# Patient Record
Sex: Male | Born: 1935 | Race: White | Hispanic: No | Smoking: Former smoker
Health system: Southern US, Community
[De-identification: ages and names within clinical notes are randomized; demographics above are authoritative.]

## PROBLEM LIST (undated history)

## (undated) DIAGNOSIS — M1990 Unspecified osteoarthritis, unspecified site: Secondary | ICD-10-CM

## (undated) DIAGNOSIS — K219 Gastro-esophageal reflux disease without esophagitis: Secondary | ICD-10-CM

## (undated) DIAGNOSIS — K589 Irritable bowel syndrome without diarrhea: Secondary | ICD-10-CM

## (undated) DIAGNOSIS — E119 Type 2 diabetes mellitus without complications: Secondary | ICD-10-CM

## (undated) DIAGNOSIS — E7800 Pure hypercholesterolemia, unspecified: Secondary | ICD-10-CM

## (undated) DIAGNOSIS — F319 Bipolar disorder, unspecified: Secondary | ICD-10-CM

## (undated) HISTORY — PX: CHOLECYSTECTOMY: SHX55

## (undated) HISTORY — DX: Irritable bowel syndrome, unspecified: K58.9

## (undated) HISTORY — PX: CATARACT EXTRACTION, BILATERAL: SHX1313

## (undated) HISTORY — PX: HEMORROIDECTOMY: SUR656

## (undated) HISTORY — DX: Gastro-esophageal reflux disease without esophagitis: K21.9

## (undated) HISTORY — DX: Unspecified osteoarthritis, unspecified site: M19.90

## (undated) HISTORY — DX: Bipolar disorder, unspecified: F31.9

## (undated) HISTORY — DX: Pure hypercholesterolemia, unspecified: E78.00

## (undated) HISTORY — DX: Type 2 diabetes mellitus without complications: E11.9

---

## 2001-03-13 ENCOUNTER — Inpatient Hospital Stay (HOSPITAL_COMMUNITY): Admission: AD | Admit: 2001-03-13 | Discharge: 2001-03-15 | Payer: Self-pay | Admitting: *Deleted

## 2001-03-13 ENCOUNTER — Encounter: Payer: Self-pay | Admitting: Emergency Medicine

## 2001-03-13 ENCOUNTER — Emergency Department (HOSPITAL_COMMUNITY): Admission: EM | Admit: 2001-03-13 | Discharge: 2001-03-13 | Payer: Self-pay | Admitting: Emergency Medicine

## 2001-03-27 ENCOUNTER — Encounter: Admission: RE | Admit: 2001-03-27 | Discharge: 2001-06-25 | Payer: Self-pay | Admitting: *Deleted

## 2002-01-20 ENCOUNTER — Encounter: Payer: Self-pay | Admitting: Otolaryngology

## 2002-01-20 ENCOUNTER — Encounter: Admission: RE | Admit: 2002-01-20 | Discharge: 2002-01-20 | Payer: Self-pay | Admitting: Otolaryngology

## 2002-01-23 ENCOUNTER — Encounter: Admission: RE | Admit: 2002-01-23 | Discharge: 2002-01-23 | Payer: Self-pay | Admitting: Otolaryngology

## 2002-01-23 ENCOUNTER — Encounter: Payer: Self-pay | Admitting: Otolaryngology

## 2005-05-05 ENCOUNTER — Encounter: Admission: RE | Admit: 2005-05-05 | Discharge: 2005-05-05 | Payer: Self-pay | Admitting: Family Medicine

## 2006-08-13 ENCOUNTER — Ambulatory Visit (HOSPITAL_COMMUNITY): Admission: RE | Admit: 2006-08-13 | Discharge: 2006-08-13 | Payer: Self-pay | Admitting: General Surgery

## 2006-08-13 ENCOUNTER — Encounter (INDEPENDENT_AMBULATORY_CARE_PROVIDER_SITE_OTHER): Payer: Self-pay | Admitting: Specialist

## 2008-01-31 ENCOUNTER — Encounter: Admission: RE | Admit: 2008-01-31 | Discharge: 2008-01-31 | Payer: Self-pay | Admitting: Family Medicine

## 2008-02-14 ENCOUNTER — Encounter: Admission: RE | Admit: 2008-02-14 | Discharge: 2008-02-14 | Payer: Self-pay | Admitting: Family Medicine

## 2009-11-16 ENCOUNTER — Inpatient Hospital Stay (HOSPITAL_COMMUNITY): Admission: EM | Admit: 2009-11-16 | Discharge: 2009-11-18 | Payer: Self-pay | Admitting: Emergency Medicine

## 2010-07-01 LAB — GLUCOSE, CAPILLARY
Glucose-Capillary: 129 mg/dL — ABNORMAL HIGH (ref 70–99)
Glucose-Capillary: 138 mg/dL — ABNORMAL HIGH (ref 70–99)

## 2010-07-01 LAB — BASIC METABOLIC PANEL
BUN: 12 mg/dL (ref 6–23)
CO2: 25 mEq/L (ref 19–32)
CO2: 27 mEq/L (ref 19–32)
Calcium: 10 mg/dL (ref 8.4–10.5)
Calcium: 9.6 mg/dL (ref 8.4–10.5)
Chloride: 105 mEq/L (ref 96–112)
Creatinine, Ser: 0.75 mg/dL (ref 0.4–1.5)
Creatinine, Ser: 0.91 mg/dL (ref 0.4–1.5)
Glucose, Bld: 116 mg/dL — ABNORMAL HIGH (ref 70–99)
Glucose, Bld: 181 mg/dL — ABNORMAL HIGH (ref 70–99)
Potassium: 3.8 mEq/L (ref 3.5–5.1)
Sodium: 140 mEq/L (ref 135–145)

## 2010-07-01 LAB — HEMOGLOBIN A1C
Hgb A1c MFr Bld: 7 % — ABNORMAL HIGH (ref <5.7)
Mean Plasma Glucose: 154 mg/dL — ABNORMAL HIGH (ref <117)

## 2010-07-01 LAB — DIFFERENTIAL
Basophils Absolute: 0 10*3/uL (ref 0.0–0.1)
Basophils Relative: 1 % (ref 0–1)
Lymphs Abs: 1.9 10*3/uL (ref 0.7–4.0)
Neutro Abs: 4 10*3/uL (ref 1.7–7.7)

## 2010-07-01 LAB — CBC
HCT: 35.3 % — ABNORMAL LOW (ref 39.0–52.0)
Hemoglobin: 11.5 g/dL — ABNORMAL LOW (ref 13.0–17.0)
Hemoglobin: 12 g/dL — ABNORMAL LOW (ref 13.0–17.0)
MCH: 30.4 pg (ref 26.0–34.0)
MCHC: 34 g/dL (ref 30.0–36.0)
MCV: 88.8 fL (ref 78.0–100.0)
Platelets: 161 10*3/uL (ref 150–400)
Platelets: 171 10*3/uL (ref 150–400)
RBC: 3.94 MIL/uL — ABNORMAL LOW (ref 4.22–5.81)

## 2010-07-01 LAB — PROTIME-INR
INR: 1.13 (ref 0.00–1.49)
Prothrombin Time: 14.7 seconds (ref 11.6–15.2)

## 2010-09-02 NOTE — Discharge Summary (Signed)
[place]  Patient:    Steven Garcia, Steven Garcia Visit Number: [id number] MRN: [id number]          Service Type: MED Location: [phone] Attending Physician:  Anastasio Auerbach Dictated by:   Viviana Simpler, M.D. Admit Date:  03/13/2001 Disc. Date: 03/15/01   CC:         Steven Grove., M.D.  Dr. Jonelle Sports   Discharge Summary  DATE OF BIRTH:  01/21/1929.  CONSULTS:  Dr. Laqueta Carina, Montez Hageman., M.D.  PROCEDURES:  Cardiolite Stress Test.  DISCHARGE DIAGNOSES: 1. Substernal chest pain consistent with unstable angina pectoris, cardiac    work-up negative.  2. History of hiatal hernia, asymptomatic on Prevacid. 3. Type 2 diabetes mellitus, new diagnosis (hemoglobin A1c 15). 4. Hypertension on arrival, new diagnosis, normotensive at discharge. 5. History of deviated septal surgery. 6. History of neck surgery, remote, with chronic episodic residual neck    pain.  DISCHARGE MEDICATIONS: 1. Prevacid, to be continued at the same dose (he does not recall). 2. Glucotrol 5 mg p.o. q.a.m.  DISCHARGE FOLLOWUP:  The patient is rescheduled with Dr. Gaylan Gerold in one week for a diabetes recheck. [facility] is [facility] education.  SUMMARY:  The patient is a 77 year old man with no known history of arthrosclerotic coronary disease, hypertension and diabetes who presented with chest pain. He awoke early on the morning of admission and went deer hunting (walked a few miles). He ate cereal for breakfast and missed his lunch. He was well until approximately noon the day of admission when, while driving, he developed sharp substernal chest pain radiating to the jaw. He went on to the bank, pain escalated, and he drove himself to the ER. Nitroglycerin IV resolved the chest pain and eased his jaw pain. He did not experience dyspnea, diaphoresis, nausea or dizziness, or arm symptoms. He had a similar episode three to four  years ago, but did not seek treatment. He is very active - one week ago was dragging a deer through the woods and although he got out of breath, he had no chest pain with this exertion. His hiatal hernia, I have discovered, has been well controlled with Prevacid and todays chest pain felt nothing like it.  PHYSICAL EXAMINATION:  VITAL SIGNS:  Blood pressure 175/103 on arrival, 135/74 after sublingual nitroglycerin administered. Pulse 90, respirations 20, afebrile.  PHYSICAL EVALUATION:  Unremarkable.  LABORATORY DATA:  Admission hemoglobin 13.9, BUN 8, creatinine 0.8, glucose 270. Chest x-ray was clear and EKG was normal sinus rhythm, nonischemic.  The patient was admitted for further work-up of chest pain concerning for unstable angina pectoris, on rule out MI protocol.  HOSPITAL COURSE BY PROBLEMS: #1 - Substernal chest pain:  The patient was placed on IV nitroglycerin and IV heparin given concern about his symptoms. He was administered aspirin and beta-blocker, and cardiology was called early in admission. Myocardial infarction was ruled out by serial enzymes and EKGs. A Cardiolite Stress Test was performed with treadmill protocol during which the patient had no symptoms and performed well. No evidence of ischemia, wall motion abnormalities, and ejection fraction is perserved at 60 to 65%. No further chest pain occurred this admission.  The patient may have suffered chest pain as a result of esophageal spasm and may have had some exacerbation by emotional stress. He has had significant social stress in the family of his 49 year old stepson who is a disabled veteran and is  currently living in a very small household. He has had extensive discussions with family members regarding this stressor and feels that they have a reasonable plan worked out to reduce his anxiety. Should further chest pain occur, reassessment would be required.  #2 - New onset diabetes mellitus:  The patient  was noted to have hyperglycemia which was confirmed as diabetes by a hemoglobin A1c of 15. He consumes several sodas per day which is probably exacerbating. While in the hospital, he was begun on Glucotrol 5 mg p.o. q.d. with supplemental sliding scale insulin with fairly good results. He has been referred for diabetes education.  First appointment at one week with Dr. Gaylan Gerold may result in titration of this medication. He seems invested in learning how to control this, and I presume that altering his soda consumption will help greatly. Dictated by:   Viviana Simpler, M.D. Attending Physician:  Anastasio Auerbach DD:  03/15/01 TD:  03/15/01 Job: [id number]

## 2010-09-02 NOTE — Consult Note (Signed)
[facility]  Patient:    BURLIN, MCNAIR Visit Number: [id number] MRN: [id number]          Service Type: MED Location: [phone] Attending Physician:  Anastasio Auerbach Dictated by:   Alvia Grove., M.D. Proc. Date: 03/13/01 Admit Date:  03/13/2001   CC:         Dyanne Carrel, M.D.  [facility]   Consultation Report  HISTORY:  The patient is a 77 year old gentleman with the recent diagnosis of hypertension and diabetes mellitus.  He presents to [facility] with some episodes of chest pain.  The patient has been quite healthy for many years.  He is quite active, and deer hunts on a regular basis.  He has been deer hunting for the past two weekends and, last week, drug a several-hundred-pound deer for several hundred yards through the woods.  He did not have any problems with that exertion.  he went hunting this morning and, again, walked for miles without any significant problems.  Later today, while driving, he developed severe substernal chest pain which radiated to his right chest and up into his jaws.  The pain lasted for between 15 and 30 minutes, and started to subside spontaneously.  He drove to the emergency room, and the pain was completely relieved with sublingual nitroglycerin.  He has been pain free since.  The pain was not associated with any diaphoresis, nausea, vomiting, syncope, or presyncope.  He denies any episodes of orthopnea or PND.  CURRENT MEDICATIONS:  None.  ALLERGIES:  None.  PAST MEDICAL HISTORY: 1. Hypertension diagnosed recently. 2. Hyperglycemia.  SOCIAL HISTORY:  The patient smoked for 30 years, but has not smoked for the past 30 or so years.  FAMILY HISTORY:  Positive for coronary artery disease.  REVIEW OF SYSTEMS:  He denies any heat or cold intolerance, weight gain or weight loss.  He denies any problems with his eyes, ears, nose or throat.  He denies any cough or sputum  production or significant shortness of breath.  He denies any previous episodes of chest pain prior to today.  He denies any problems with his urinary system or GI tract.  He denies any nausea, vomiting or change in his bowel habits.  He denies any hematuria or dysuria.  he denies any rash, skin nodules or easy bruisability.  PHYSICAL EXAMINATION:  GENERAL:  Elderly gentleman in no acute distress.  He is alert and oriented x 3 and his mood and affect are normal.  VITAL SIGNS:  Initial blood pressure was 175/103.  Later, it was 132/80 on a nitroglycerin drip.  His heart rate was 90.  His respirations were 20 and his temperature was 97.8.  NECK:  Carotids were 2+.  No bruits.  No JVD.  No thyromegaly.  No lymphadenopathy.  LUNGS:  Clear to auscultation.  BACK:  Nontender.  HEART:  Regular rate.  S1 and S2.  PMI is nondisplaced.  No murmurs.  ABDOMEN:  Good bowel sounds.  Nontender.  EXTREMITIES:  No clubbing, cyanosis, or edema.  No calf tenderness.  Good pulses.  NEUROLOGIC:  Nonfocal.  LABORATORY DATA:  EKG revealed normal sinus rhythm.  No ST or T wave changes. Essentially, the EKG was normal.  CPK enzymes were negative.  The rest of his lab work is essentially normal.  ASSESSMENT:  The patient presents with episodes of chest pain with radiation to his jaws.  It was not associated with any diaphoresis, nausea, vomiting  or presyncope.  The pain is somewhat concerning for unstable angina.  I agree with the plans to admit him and to collect cardiac enzymes.  We will continue him in heparin tonight.  Will schedule him for a stress Cardiolite study for tomorrow.  If he has any further episodes of chest pain or has positive cardiac enzymes, then we will need to consider a heart catheterization on Friday.  The patients blood pressure is fairly well controlled on his nitroglycerin drip.  I suspect that he will be able to come under fairly good control with a small amount of  medications.  His glucose level has been fairly well elevated here.  His BCG was 231.  He will need some counseling regarding his diet.  He may need to be started on some diabetic oral agents.  Thank you very much for this consult. Dictated by:   Alvia Grove., M.D. Attending Physician:  Anastasio Auerbach DD:  03/13/01 TD:  03/14/01 Job: [id number]

## 2010-09-02 NOTE — Op Note (Signed)
Steven Garcia, Steven Garcia               ACCOUNT NO.:  [id number]   MEDICAL RECORD NO.:  [id number]          PATIENT TYPE:  AMB   LOCATION:  DAY                          FACILITY:  [facility]   PHYSICIAN:  Angelia Mould. Derrell Lolling, M.D.DATE OF BIRTH:  [birth year]   DATE OF PROCEDURE:  08/13/2006  DATE OF DISCHARGE:                               OPERATIVE REPORT   PREOPERATIVE DIAGNOSIS:  Internal and external hemorrhoids, inflamed  anal papilla.   POSTOPERATIVE DIAGNOSIS:  Internal and external hemorrhoids, inflamed  anal papilla.   OPERATIONS PERFORMED:  Rigid proctoscopy, anal dilatation, internal and  external hemorrhoidectomy (right anterior, posterior).   SURGEON:  Angelia Mould. Derrell Lolling, M.D.   OPERATIVE INDICATIONS:  This is a 77 year old white man who has ongoing  problems with hemorrhoids.  He has been injected for bleeding internal  hemorrhoids 3 or 4 times, which gives him transient relief.  He  continues to have protrusion and bleeding.  On exam he has minimal  external hemorrhoids, but he has significant internal hemorrhoids  posteriorly and on the right anterior.  He also has, what looks like, an  anal papilla perched on top of the posterior hemorrhoid -- not much on  the left.  Options for intervention were discussed and we decided to go  ahead with hemorrhoidectomy.   OPERATIVE TECHNIQUE:  Following the induction of general endotracheal  anesthesia, the patient was placed in the dorsalithotomy position.  The  perianal area was prepped and draped in a sterile fashion.  Externally  the sphincter muscles were infiltrated with 0.5% Marcaine with  epinephrine -- a total of about 15-20 mL.  Internally I injected his  internal hemorrhoids with 0.5% Marcaine with epinephrine, 9 mL, mixed  with Wydase 1 mL.  We did a gentle anal dilatation over the space of  about 4 or 5 minutes.  I inserted the anoscope and observed fairly  significant hemorrhoidal complex posteriorly with the anal papilla  associated with this, and also some internal hemorrhoids with thrombosis  anteriorly.   Both of these areas were excised in the following manner:  I placed a  transfixion suture of 2-0 Vicryl at the apex of the hemorrhoidal pile.  The hemorrhoids were excised with electrocautery with good traction and  counter traction; leaving the sphincter muscles intact.  The mucosa was  then closed with a running, locking suture of 2-0 Vicryl; and the suture  line was taken just out past the dentate line  a little bit.  This was  done posteriorly and then in the right anterior position.  The areas  were then inspected and the suture lines were found to be complete with  good closure; no gaps.  No bleeding.  No hematoma.  There did not appear  to be any significant hemorrhoidal tissue on the left that required  excision, and so we stopped at that point.  We observed this for about 5  minutes and saw no bleeding.  External bandages were placed and the  patient taken to the recovery room in stable condition.   ESTIMATED BLOOD LOSS:  About 20 mL or less.  COMPLICATIONS:  None.   COUNTS:  Sponge, needle and instrument counts were correct.      Angelia Mould. Derrell Lolling, M.D.  Electronically Signed     HMI/MEDQ  D:  08/13/2006  T:  08/14/2006  Job:  [id number]   cc:   Bryan Lemma. Manus Gunning, M.D.  Fax: [phone]

## 2011-09-08 ENCOUNTER — Ambulatory Visit
Admission: RE | Admit: 2011-09-08 | Discharge: 2011-09-08 | Disposition: A | Discharge status: Home / Self Care | Payer: Medicare Other | Source: Ambulatory Visit | Attending: Family Medicine | Admitting: Family Medicine

## 2011-09-08 ENCOUNTER — Other Ambulatory Visit: Payer: Self-pay | Admitting: Family Medicine

## 2011-09-08 DIAGNOSIS — R9389 Abnormal findings on diagnostic imaging of other specified body structures: Secondary | ICD-10-CM

## 2011-09-08 DIAGNOSIS — R05 Cough: Secondary | ICD-10-CM

## 2013-02-24 ENCOUNTER — Ambulatory Visit
Admission: RE | Admit: 2013-02-24 | Discharge: 2013-02-24 | Disposition: A | Discharge status: Home / Self Care | Payer: Medicare Other | Source: Ambulatory Visit | Attending: Family Medicine | Admitting: Family Medicine

## 2013-02-24 ENCOUNTER — Other Ambulatory Visit: Payer: Self-pay | Admitting: Family Medicine

## 2013-02-24 DIAGNOSIS — E871 Hypo-osmolality and hyponatremia: Secondary | ICD-10-CM

## 2013-07-08 ENCOUNTER — Encounter: Payer: Self-pay | Admitting: Neurology

## 2013-07-09 ENCOUNTER — Ambulatory Visit: Payer: Medicare Other | Admitting: Neurology

## 2013-07-14 ENCOUNTER — Encounter: Payer: Self-pay | Admitting: Neurology

## 2013-07-22 ENCOUNTER — Encounter: Payer: Self-pay | Admitting: Neurology

## 2013-07-22 ENCOUNTER — Ambulatory Visit: Payer: Medicare Other | Admitting: Neurology

## 2013-07-23 ENCOUNTER — Encounter: Payer: Self-pay | Admitting: Neurology

## 2013-07-23 ENCOUNTER — Ambulatory Visit (INDEPENDENT_AMBULATORY_CARE_PROVIDER_SITE_OTHER): Payer: Medicare Other | Admitting: Neurology

## 2013-07-23 ENCOUNTER — Encounter (INDEPENDENT_AMBULATORY_CARE_PROVIDER_SITE_OTHER): Payer: Self-pay

## 2013-07-23 VITALS — BP 115/67 | HR 77 | Ht 71.25 in | Wt 164.0 lb

## 2013-07-23 DIAGNOSIS — R259 Unspecified abnormal involuntary movements: Secondary | ICD-10-CM

## 2013-07-23 DIAGNOSIS — R251 Tremor, unspecified: Secondary | ICD-10-CM

## 2013-07-23 DIAGNOSIS — F319 Bipolar disorder, unspecified: Secondary | ICD-10-CM

## 2013-07-23 NOTE — Progress Notes (Signed)
[facility]    Provider:  Dr Janann Colonel Referring Provider: Gaynelle Arabian, MD Primary Care Physician:  Simona Huh, MD  CC:  Tremor evaluation  HPI:  Steven Garcia is a 79 y.o. male here as a referral from Dr. Marisue Humble for tremor evaluation  Notes the tremor started around 2 years ago. His brother noted his hands were shaking and pointed it out to Steven Garcia. He describes it predominantly as an action/intention tremor. He denies any rest component. Notes his handwriting has gotten messier over time, denies any micrographia. Will sometimes spill food when trying to eat due to tremor. No noted muscle cramping or aches. Notes some gait difficulty, slow walking but states he has a short leg and back pain that contribute to this. Tremor is worse when he is stressed or anxious. No change with caffeine. No change with EtOH. He is currently sleeping well at night. Has been told in the past that he talks in his sleep but no other known REM behavior disorder. No constipation.   Currently taking depakote $RemoveBeforeD'500mg'dpwejDecoMwifA$  three times a day for mood disorder/bipolar. He has been on a stable dose for greater than 8years. This is managed by his primary care physician Dr. Marisue Humble and patient feels it is well controlled. Has not been on any dopamine blocking agents.   No family history of tremor. He does not feel the tremor is causing much difficulty, he only notes trouble when trying to write checks.   Review of Systems: Out of a complete 14 system review, the patient complains of only the following symptoms, and all other reviewed systems are negative. + weakness, tremors, decreased energy  History   Social History  . Marital Status: Widowed    Spouse Name: N/A    Number of Children: N/A  . Years of Education: N/A   Occupational History  . Not on file.   Social History Main Topics  . Smoking status: Former Research scientist (life sciences)  . Smokeless tobacco: Not on file  . Alcohol Use: Yes     Comment:  beer occ  . Drug Use: No  . Sexual Activity: Not on file   Other Topics Concern  . Not on file   Social History Narrative   Single, 1 child   Right handed   10th grade education   Very little caffeine    No family history on file.  Past Medical History  Diagnosis Date  . Diabetes     with proteinuria  . Hypercholesteremia   . Arthritis   . IBS (irritable bowel syndrome)   . GERD (gastroesophageal reflux disease)   . Bipolar disorder     Past Surgical History  Procedure Laterality Date  . Cholecystectomy    . Hemorroidectomy    . Cataract extraction, bilateral      Current Outpatient Prescriptions  Medication Sig Dispense Refill  . ACCU-CHEK AVIVA PLUS test strip       . ACCU-CHEK SOFTCLIX LANCETS lancets       . acetaminophen-codeine (TYLENOL #3) 300-30 MG per tablet       . Alum & Mag Hydroxide-Simeth (MAGIC MOUTHWASH) SOLN Take 5 mLs by mouth.      Marland Kitchen amoxicillin (AMOXIL) 875 MG tablet       . aspirin 81 MG tablet Take 81 mg by mouth daily.      . Blood Glucose Monitoring Suppl (ACCU-CHEK AVIVA PLUS) W/DEVICE KIT       . chlorhexidine (PERIDEX) 0.12 % solution       .  clindamycin (CLEOCIN) 150 MG capsule       . divalproex (DEPAKOTE) 500 MG DR tablet Take 500 mg by mouth 3 (three) times daily.      Marland Kitchen glipiZIDE (GLUCOTROL) 5 MG tablet Take by mouth daily before breakfast.      . lisinopril (PRINIVIL,ZESTRIL) 20 MG tablet Take 20 mg by mouth daily.      Marland Kitchen lovastatin (MEVACOR) 20 MG tablet Take 20 mg by mouth at bedtime.      . metformin (FORTAMET) 500 MG (OSM) 24 hr tablet Take 500 mg by mouth 2 (two) times daily with a meal.      . Omega-3 Fatty Acids (FISH OIL) 1000 MG CAPS Take by mouth daily.      Marland Kitchen omeprazole (PRILOSEC) 20 MG capsule Take 20 mg by mouth daily.       No current facility-administered medications for this visit.    Allergies as of 07/23/2013 - Review Complete 07/23/2013  Allergen Reaction Noted  . Codeine phosphate Other (See Comments)  07/22/2013  . Lisinopril Cough 07/22/2013  . Protonix [pantoprazole sodium] Diarrhea 07/22/2013  . Seroquel [quetiapine fumarate] Other (See Comments) 07/22/2013    Vitals: BP 115/67  Pulse 77  Ht 5' 11.25" (1.81 m)  Wt 164 lb (74.39 kg)  BMI 22.71 kg/m2 Last Weight:  Wt Readings from Last 1 Encounters:  07/23/13 164 lb (74.39 kg)   Last Height:   Ht Readings from Last 1 Encounters:  07/23/13 5' 11.25" (1.81 m)     Physical exam: Exam: Gen: NAD, conversant Eyes: anicteric sclerae, moist conjunctivae HENT: Atraumatic, oropharynx clear Neck: Trachea midline; supple,  Lungs: CTA, no wheezing, rales, rhonic                          CV: RRR, no MRG Abdomen: Soft, non-tender;  Extremities: No peripheral edema  Skin: Normal temperature, no rash,  Psych: Appropriate affect, pleasant  Neuro: MS: AA&Ox3, appropriately interactive, normal affect   Speech: fluent w/o paraphasic error  CN: PERRL, EOMI no nystagmus, no ptosis, sensation intact to LT V1-V3 bilat, face symmetric, no weakness, hearing grossly intact, palate elevates symmetrically, shoulder shrug 5/5 bilat,  tongue protrudes midline, no fasiculations noted.  Motor: normal bulk and tone, no masked facies Strength: 5/5  In all extremities  Coord: mild rest tremor bilateral hands with distraction, mild postural and intention tremor bilateral hands. Mild  bradykinesia with finger taps, hand opening and foot taps.   Reflexes: symmetrical, bilat downgoing toes  Sens: LT intact in all extremities  Gait: stands upright without difficulty, slightly wide based, antalgic favoring left left, no shuffling steps, good arm swing bilaterally, no re-emergent tremor   Assessment:  After physical and neurologic examination, review of laboratory studies, imaging, neurophysiology testing and pre-existing records, assessment will be reviewed on the problem list.  Plan:  Treatment plan and additional workup will be reviewed under  Problem List.  1)Tremor 2)bipolar disorder  80y/o gentleman, with history of bipolar on VPA, presenting for initial evaluation of tremor. Notes both a rest and postural/action tremor, mild bradykinesia, messy handwriting with minimal micrographia. History is negative for non-motor symptoms of parkinson's. 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 Patient has features/exam findings consistent with both a parkinsonism and ET so these would also be on the differential but suspect it is likely medication induced. Would consider trying to switch to an alternative bipolar medication,  such as lamictal, but this needs to be weighed against risk of exacerbating his stable bipolar disorder. With history of bipolar disorder he will be high risk to treat with parkinson medication. Cased discussed with PCP, Dr Marisue Humble, will hold off on making any medication changes at this time due to overall stability of bipolar and mildness of symptoms.   Jim Like, DO  [facility] [address] [place], [place] [address]  Phone [phone] Fax [phone]

## 2013-11-17 ENCOUNTER — Emergency Department (HOSPITAL_COMMUNITY): Payer: Medicare Other

## 2013-11-17 ENCOUNTER — Inpatient Hospital Stay (HOSPITAL_COMMUNITY)
Admission: EM | Admit: 2013-11-17 | Discharge: 2013-11-21 | DRG: 064 | Disposition: A | Discharge status: Hospice | Payer: Medicare Other | Attending: Neurology | Admitting: Neurology

## 2013-11-17 ENCOUNTER — Encounter (HOSPITAL_COMMUNITY): Payer: Self-pay | Admitting: Emergency Medicine

## 2013-11-17 DIAGNOSIS — F319 Bipolar disorder, unspecified: Secondary | ICD-10-CM | POA: Diagnosis present

## 2013-11-17 DIAGNOSIS — E871 Hypo-osmolality and hyponatremia: Secondary | ICD-10-CM | POA: Diagnosis present

## 2013-11-17 DIAGNOSIS — G936 Cerebral edema: Secondary | ICD-10-CM | POA: Diagnosis present

## 2013-11-17 DIAGNOSIS — Z7982 Long term (current) use of aspirin: Secondary | ICD-10-CM | POA: Diagnosis not present

## 2013-11-17 DIAGNOSIS — K117 Disturbances of salivary secretion: Secondary | ICD-10-CM

## 2013-11-17 DIAGNOSIS — G8190 Hemiplegia, unspecified affecting unspecified side: Secondary | ICD-10-CM | POA: Diagnosis present

## 2013-11-17 DIAGNOSIS — Z66 Do not resuscitate: Secondary | ICD-10-CM | POA: Diagnosis not present

## 2013-11-17 DIAGNOSIS — Z9849 Cataract extraction status, unspecified eye: Secondary | ICD-10-CM

## 2013-11-17 DIAGNOSIS — D72829 Elevated white blood cell count, unspecified: Secondary | ICD-10-CM | POA: Diagnosis present

## 2013-11-17 DIAGNOSIS — Z87891 Personal history of nicotine dependence: Secondary | ICD-10-CM | POA: Diagnosis not present

## 2013-11-17 DIAGNOSIS — E119 Type 2 diabetes mellitus without complications: Secondary | ICD-10-CM

## 2013-11-17 DIAGNOSIS — Z515 Encounter for palliative care: Secondary | ICD-10-CM | POA: Diagnosis not present

## 2013-11-17 DIAGNOSIS — J9819 Other pulmonary collapse: Secondary | ICD-10-CM | POA: Diagnosis present

## 2013-11-17 DIAGNOSIS — K589 Irritable bowel syndrome without diarrhea: Secondary | ICD-10-CM | POA: Diagnosis present

## 2013-11-17 DIAGNOSIS — R29810 Facial weakness: Secondary | ICD-10-CM | POA: Diagnosis present

## 2013-11-17 DIAGNOSIS — IMO0002 Reserved for concepts with insufficient information to code with codable children: Secondary | ICD-10-CM

## 2013-11-17 DIAGNOSIS — M129 Arthropathy, unspecified: Secondary | ICD-10-CM | POA: Diagnosis present

## 2013-11-17 DIAGNOSIS — R0689 Other abnormalities of breathing: Secondary | ICD-10-CM

## 2013-11-17 DIAGNOSIS — I618 Other nontraumatic intracerebral hemorrhage: Secondary | ICD-10-CM

## 2013-11-17 DIAGNOSIS — G9340 Encephalopathy, unspecified: Secondary | ICD-10-CM | POA: Diagnosis present

## 2013-11-17 DIAGNOSIS — Y92009 Unspecified place in unspecified non-institutional (private) residence as the place of occurrence of the external cause: Secondary | ICD-10-CM | POA: Diagnosis not present

## 2013-11-17 DIAGNOSIS — E785 Hyperlipidemia, unspecified: Secondary | ICD-10-CM | POA: Diagnosis present

## 2013-11-17 DIAGNOSIS — Z79899 Other long term (current) drug therapy: Secondary | ICD-10-CM | POA: Diagnosis not present

## 2013-11-17 DIAGNOSIS — I619 Nontraumatic intracerebral hemorrhage, unspecified: Principal | ICD-10-CM | POA: Diagnosis present

## 2013-11-17 DIAGNOSIS — R471 Dysarthria and anarthria: Secondary | ICD-10-CM | POA: Diagnosis present

## 2013-11-17 DIAGNOSIS — R0600 Dyspnea, unspecified: Secondary | ICD-10-CM

## 2013-11-17 DIAGNOSIS — I10 Essential (primary) hypertension: Secondary | ICD-10-CM

## 2013-11-17 DIAGNOSIS — K219 Gastro-esophageal reflux disease without esophagitis: Secondary | ICD-10-CM | POA: Diagnosis present

## 2013-11-17 DIAGNOSIS — R0681 Apnea, not elsewhere classified: Secondary | ICD-10-CM | POA: Diagnosis not present

## 2013-11-17 DIAGNOSIS — R404 Transient alteration of awareness: Secondary | ICD-10-CM | POA: Diagnosis not present

## 2013-11-17 LAB — COMPREHENSIVE METABOLIC PANEL
ALBUMIN: 3.8 g/dL (ref 3.5–5.2)
ALT: 29 U/L (ref 0–53)
AST: 72 U/L — AB (ref 0–37)
Alkaline Phosphatase: 48 U/L (ref 39–117)
Anion gap: 13 (ref 5–15)
BILIRUBIN TOTAL: 1 mg/dL (ref 0.3–1.2)
BUN: 9 mg/dL (ref 6–23)
CALCIUM: 10.9 mg/dL — AB (ref 8.4–10.5)
CO2: 24 meq/L (ref 19–32)
Chloride: 91 mEq/L — ABNORMAL LOW (ref 96–112)
Creatinine, Ser: 0.76 mg/dL (ref 0.50–1.35)
GFR calc Af Amer: >90 mL/min (ref >90)
GFR calc non Af Amer: 82 mL/min — ABNORMAL LOW (ref >90)
Glucose, Bld: 131 mg/dL — ABNORMAL HIGH (ref 70–99)
Potassium: 5 mEq/L (ref 3.7–5.3)
SODIUM: 128 meq/L — AB (ref 137–147)
Total Protein: 8.1 g/dL (ref 6.0–8.3)

## 2013-11-17 LAB — RAPID URINE DRUG SCREEN, HOSP PERFORMED
AMPHETAMINES: NOT DETECTED
BENZODIAZEPINES: NOT DETECTED
Barbiturates: NOT DETECTED
COCAINE: NOT DETECTED
Opiates: NOT DETECTED
Tetrahydrocannabinol: NOT DETECTED

## 2013-11-17 LAB — DIFFERENTIAL
BASOS ABS: 0 10*3/uL (ref 0.0–0.1)
Basophils Relative: 0 % (ref 0–1)
Eosinophils Absolute: 0 10*3/uL (ref 0.0–0.7)
Eosinophils Relative: 0 % (ref 0–5)
LYMPHS PCT: 10 % — AB (ref 12–46)
Lymphs Abs: 1.3 10*3/uL (ref 0.7–4.0)
Monocytes Absolute: 1.3 10*3/uL — ABNORMAL HIGH (ref 0.1–1.0)
Monocytes Relative: 10 % (ref 3–12)
NEUTROS ABS: 10.8 10*3/uL — AB (ref 1.7–7.7)
Neutrophils Relative %: 80 % — ABNORMAL HIGH (ref 43–77)

## 2013-11-17 LAB — CBC
HCT: 40.6 % (ref 39.0–52.0)
Hemoglobin: 14.4 g/dL (ref 13.0–17.0)
MCH: 30.9 pg (ref 26.0–34.0)
MCHC: 35.5 g/dL (ref 30.0–36.0)
MCV: 87.1 fL (ref 78.0–100.0)
Platelets: 183 10*3/uL (ref 150–400)
RBC: 4.66 MIL/uL (ref 4.22–5.81)
RDW: 12.8 % (ref 11.5–15.5)
WBC: 13.4 10*3/uL — AB (ref 4.0–10.5)

## 2013-11-17 LAB — ETHANOL

## 2013-11-17 LAB — I-STAT CHEM 8, ED
BUN: 8 mg/dL (ref 6–23)
CHLORIDE: 94 meq/L — AB (ref 96–112)
Calcium, Ion: 1.35 mmol/L — ABNORMAL HIGH (ref 1.13–1.30)
Creatinine, Ser: 0.9 mg/dL (ref 0.50–1.35)
Glucose, Bld: 139 mg/dL — ABNORMAL HIGH (ref 70–99)
HEMATOCRIT: 47 % (ref 39.0–52.0)
Hemoglobin: 16 g/dL (ref 13.0–17.0)
POTASSIUM: 4.7 meq/L (ref 3.7–5.3)
SODIUM: 128 meq/L — AB (ref 137–147)
TCO2: 26 mmol/L (ref 0–100)

## 2013-11-17 LAB — APTT: APTT: 29 s (ref 24–37)

## 2013-11-17 LAB — I-STAT VENOUS BLOOD GAS, ED
ACID-BASE EXCESS: 3 mmol/L — AB (ref 0.0–2.0)
Bicarbonate: 27.1 mEq/L — ABNORMAL HIGH (ref 20.0–24.0)
O2 SAT: 50 %
TCO2: 28 mmol/L (ref 0–100)
pCO2, Ven: 40.1 mmHg — ABNORMAL LOW (ref 45.0–50.0)
pH, Ven: 7.439 — ABNORMAL HIGH (ref 7.250–7.300)
pO2, Ven: 26 mmHg — CL (ref 30.0–45.0)

## 2013-11-17 LAB — URINE MICROSCOPIC-ADD ON

## 2013-11-17 LAB — URINALYSIS, ROUTINE W REFLEX MICROSCOPIC
BILIRUBIN URINE: NEGATIVE
GLUCOSE, UA: 250 mg/dL — AB
KETONES UR: 15 mg/dL — AB
LEUKOCYTES UA: NEGATIVE
Nitrite: NEGATIVE
PH: 6.5 (ref 5.0–8.0)
PROTEIN: 100 mg/dL — AB
Specific Gravity, Urine: 1.012 (ref 1.005–1.030)
Urobilinogen, UA: 1 mg/dL (ref 0.0–1.0)

## 2013-11-17 LAB — VALPROIC ACID LEVEL: Valproic Acid Lvl: 44.8 ug/mL — ABNORMAL LOW (ref 50.0–100.0)

## 2013-11-17 LAB — I-STAT CG4 LACTIC ACID, ED: Lactic Acid, Venous: 2.52 mmol/L — ABNORMAL HIGH (ref 0.5–2.2)

## 2013-11-17 LAB — CK: CK TOTAL: 2587 U/L — AB (ref 7–232)

## 2013-11-17 LAB — I-STAT TROPONIN, ED: Troponin i, poc: 0.01 ng/mL (ref 0.00–0.08)

## 2013-11-17 LAB — PROTIME-INR
INR: 1.02 (ref 0.00–1.49)
PROTHROMBIN TIME: 13.4 s (ref 11.6–15.2)

## 2013-11-17 MED ORDER — PANTOPRAZOLE SODIUM 40 MG IV SOLR
40.0000 mg | Freq: Every day | INTRAVENOUS | Status: DC
Start: 2013-11-17 — End: 2013-11-17

## 2013-11-17 MED ORDER — THIAMINE HCL 100 MG/ML IJ SOLN
100.0000 mg | Freq: Once | INTRAMUSCULAR | Status: AC
  Administered 2013-11-17: 100 mg via INTRAVENOUS
  Filled 2013-11-17: qty 2

## 2013-11-17 MED ORDER — MANNITOL 20 % IV SOLN
0.2500 g/kg | Freq: Three times a day (TID) | INTRAVENOUS | Status: DC
  Filled 2013-11-17 (×2): qty 500

## 2013-11-17 MED ORDER — PANTOPRAZOLE SODIUM 40 MG IV SOLR
40.0000 mg | Freq: Every day | INTRAVENOUS | Status: DC
Start: 2013-11-17 — End: 2013-11-20
  Administered 2013-11-17 – 2013-11-19 (×3): 40 mg via INTRAVENOUS
  Filled 2013-11-17 (×3): qty 40

## 2013-11-17 MED ORDER — SENNOSIDES-DOCUSATE SODIUM 8.6-50 MG PO TABS
1.0000 | ORAL_TABLET | Freq: Two times a day (BID) | ORAL | Status: DC
  Administered 2013-11-19 – 2013-11-20 (×3): 1 via ORAL
  Filled 2013-11-17 (×7): qty 1

## 2013-11-17 MED ORDER — MANNITOL 20 % IV SOLN
0.2500 g/kg | Freq: Once | INTRAVENOUS | Status: AC
  Administered 2013-11-17: 18.6 g via INTRAVENOUS
  Filled 2013-11-17 (×2): qty 500

## 2013-11-17 MED ORDER — SODIUM CHLORIDE 0.9 % IV SOLN: 250.0000 mL | INTRAVENOUS | Status: DC | PRN

## 2013-11-17 MED ORDER — STROKE: EARLY STAGES OF RECOVERY BOOK
Freq: Once | Status: AC
  Administered 2013-11-17: 22:00:00
  Filled 2013-11-17: qty 1

## 2013-11-17 MED ORDER — LABETALOL HCL 5 MG/ML IV SOLN
10.0000 mg | INTRAVENOUS | Status: DC | PRN
  Administered 2013-11-18 – 2013-11-19 (×4): 20 mg via INTRAVENOUS
  Administered 2013-11-19: 40 mg via INTRAVENOUS
  Administered 2013-11-19 – 2013-11-20 (×2): 20 mg via INTRAVENOUS
  Filled 2013-11-17: qty 8
  Filled 2013-11-17 (×2): qty 4
  Filled 2013-11-17: qty 8
  Filled 2013-11-17 (×4): qty 4

## 2013-11-17 MED ORDER — ACETAMINOPHEN 650 MG RE SUPP
650.0000 mg | RECTAL | Status: DC | PRN
  Administered 2013-11-18: 650 mg via RECTAL
  Filled 2013-11-17: qty 1

## 2013-11-17 MED ORDER — SODIUM CHLORIDE 0.9 % IV BOLUS (SEPSIS)
1000.0000 mL | Freq: Once | INTRAVENOUS | Status: AC
  Administered 2013-11-17: 1000 mL via INTRAVENOUS

## 2013-11-17 MED ORDER — MANNITOL 25 % IV SOLN
18.6000 g | Freq: Three times a day (TID) | INTRAVENOUS | Status: DC
  Filled 2013-11-17: qty 74.4

## 2013-11-17 MED ORDER — INSULIN ASPART 100 UNIT/ML ~~LOC~~ SOLN
0.0000 [IU] | SUBCUTANEOUS | Status: DC
Start: 2013-11-17 — End: 2013-11-19
  Administered 2013-11-18 – 2013-11-19 (×2): 2 [IU] via SUBCUTANEOUS

## 2013-11-17 MED ORDER — VALPROATE SODIUM 500 MG/5ML IV SOLN
500.0000 mg | Freq: Three times a day (TID) | INTRAVENOUS | Status: DC
  Administered 2013-11-17 – 2013-11-20 (×9): 500 mg via INTRAVENOUS
  Filled 2013-11-17 (×12): qty 5

## 2013-11-17 MED ORDER — MANNITOL 20 % IV SOLN
0.2500 g/kg | Freq: Three times a day (TID) | INTRAVENOUS | Status: DC
  Administered 2013-11-17 – 2013-11-19 (×6): 18.6 g via INTRAVENOUS
  Filled 2013-11-17 (×10): qty 500

## 2013-11-17 MED ORDER — SODIUM CHLORIDE 0.9 % IV SOLN
INTRAVENOUS | Status: DC
Start: 2013-11-17 — End: 2013-11-19
  Administered 2013-11-17 – 2013-11-19 (×3): via INTRAVENOUS

## 2013-11-17 MED ORDER — ACETAMINOPHEN 325 MG PO TABS
650.0000 mg | ORAL_TABLET | ORAL | Status: DC | PRN
  Administered 2013-11-19: 650 mg via ORAL
  Filled 2013-11-17: qty 2

## 2013-11-17 NOTE — ED Provider Notes (Addendum)
CSN: [id number]     Arrival date & time 11/17/13  1655 History   First MD Initiated Contact with Patient 11/17/13 1712     Chief Complaint  Patient presents with  . Altered Mental Status   Level 5 caveat altered mental status acutity situation. History is obtained from paramedics. I attempted to call patient's home, no answer.  (Consider location/radiation/quality/duration/timing/severity/associated sxs/prior Treatment) HPI Patient was found face down in his home unresponsive the family residence at 41 5 PM today. He was last seen normal at 5:30 PM yesterday. EMS obtained CBG which was 130. Patient is unable to offer complaint. He speech is slurred, and in comprehensible Past Medical History  Diagnosis Date  . Diabetes     with proteinuria  . Hypercholesteremia   . Arthritis   . IBS (irritable bowel syndrome)   . GERD (gastroesophageal reflux disease)   . Bipolar disorder    Past Surgical History  Procedure Laterality Date  . Cholecystectomy    . Hemorroidectomy    . Cataract extraction, bilateral     History reviewed. No pertinent family history. History  Substance Use Topics  . Smoking status: Former Research scientist (life sciences)  . Smokeless tobacco: Not on file  . Alcohol Use: Yes     Comment: beer occ    Review of Systems  Unable to perform ROS: Mental status change      Allergies  Codeine phosphate; Lisinopril; Protonix; and Seroquel  Home Medications   Prior to Admission medications   Medication Sig Start Date End Date Taking? Authorizing Provider  ACCU-CHEK AVIVA PLUS test strip  06/04/13   Historical Provider, MD  ACCU-CHEK SOFTCLIX LANCETS lancets  06/04/13   Historical Provider, MD  acetaminophen-codeine (TYLENOL #3) 300-30 MG per tablet  05/27/13   Historical Provider, MD  Alum & Mag Hydroxide-Simeth (MAGIC MOUTHWASH) SOLN Take 5 mLs by mouth.    Historical Provider, MD  amoxicillin (AMOXIL) 875 MG tablet  05/27/13   Historical Provider, MD  aspirin 81 MG tablet Take 81 mg by  mouth daily.    Historical Provider, MD  Blood Glucose Monitoring Suppl (ACCU-CHEK AVIVA PLUS) W/DEVICE KIT  06/04/13   Historical Provider, MD  chlorhexidine (PERIDEX) 0.12 % solution  06/20/13   Historical Provider, MD  clindamycin (CLEOCIN) 150 MG capsule  06/20/13   Historical Provider, MD  divalproex (DEPAKOTE) 500 MG DR tablet Take 500 mg by mouth 3 (three) times daily.    Historical Provider, MD  glipiZIDE (GLUCOTROL) 5 MG tablet Take by mouth daily before breakfast.    Historical Provider, MD  lisinopril (PRINIVIL,ZESTRIL) 20 MG tablet Take 20 mg by mouth daily.    Historical Provider, MD  lovastatin (MEVACOR) 20 MG tablet Take 20 mg by mouth at bedtime.    Historical Provider, MD  metformin (FORTAMET) 500 MG (OSM) 24 hr tablet Take 500 mg by mouth 2 (two) times daily with a meal.    Historical Provider, MD  Omega-3 Fatty Acids (FISH OIL) 1000 MG CAPS Take by mouth daily.    Historical Provider, MD  omeprazole (PRILOSEC) 20 MG capsule Take 20 mg by mouth daily.    Historical Provider, MD   BP 149/72  Pulse 97  Temp(Src) 96.9 F (36.1 C) (Rectal)  Resp 20  SpO2 100% Physical Exam  Nursing note and vitals reviewed. Constitutional:  Ill-appearing  HENT:  Head: Normocephalic and atraumatic.  Mucous membranes dry  Eyes: Conjunctivae are normal.  Pupils pinpoint bilaterally eyes deviated to the right  Neck: Neck supple.  No tracheal deviation present. No thyromegaly present.  Cardiovascular: Normal rate and regular rhythm.   No murmur heard. Pulmonary/Chest: Effort normal and breath sounds normal.  Abdominal: Soft. Bowel sounds are normal. He exhibits no distension. There is no tenderness.  Musculoskeletal: Normal range of motion. He exhibits no edema and no tenderness.  Neurological: He is alert. Coordination normal.  Follow simple commands. Motor strength right upper extremity 4/5 right lower extremity 2/5. Left upper extremity 0/5. Left lower extremity 1/5. DTRs symmetric bilaterally  at knee jerk ankle jerk and biceps toes upward or downward going bilaterally  Skin: Skin is warm and dry. No rash noted.   NIH stroke score at least 18. 6:40 PM exam is unchanged. Patient follow simple commands. Remains flaccid at left upper cavity and left lower extremity I spoke with patient's son who states that she talked to him on the phone at 5:30 PM yesterday afternoon. He seemed normal then. He was found this afternoon face down on the floor ED Course  Procedures (including critical care time) Labs Review Labs Reviewed  ETHANOL  PROTIME-INR  APTT  CBC  DIFFERENTIAL  COMPREHENSIVE METABOLIC PANEL  URINE RAPID DRUG SCREEN (HOSP PERFORMED)  URINALYSIS, ROUTINE W REFLEX MICROSCOPIC  I-STAT CHEM 8, ED  I-STAT TROPOININ, ED    Imaging Review No results found.   EKG Interpretation   Date/Time:  Monday November 17 2013 17:03:46 EDT Ventricular Rate:  96 PR Interval:    QRS Duration: 100 QT Interval:  367 QTC Calculation: 464 R Axis:   -48 Text Interpretation:  Age not entered, assumed to be  80 years old for  purpose of ECG interpretation Atrial flutter Left anterior fascicular  block Artifact in lead(s) I II III aVR aVL aVF V1 V2 No significant change  since last tracing Confirmed by Winfred Leeds  MD, Anayia Emani [phone]) on 11/17/2013  5:27:55 PM       Results for orders placed during the hospital encounter of 11/17/13  ETHANOL      Result Value Ref Range   Alcohol, Ethyl (B) <11  0 - 11 mg/dL  PROTIME-INR      Result Value Ref Range   Prothrombin Time 13.4  11.6 - 15.2 seconds   INR 1.02  0.00 - 1.49  APTT      Result Value Ref Range   aPTT 29  24 - 37 seconds  CBC      Result Value Ref Range   WBC 13.4 (*) 4.0 - 10.5 K/uL   RBC 4.66  4.22 - 5.81 MIL/uL   Hemoglobin 14.4  13.0 - 17.0 g/dL   HCT 40.6  39.0 - 52.0 %   MCV 87.1  78.0 - 100.0 fL   MCH 30.9  26.0 - 34.0 pg   MCHC 35.5  30.0 - 36.0 g/dL   RDW 12.8  11.5 - 15.5 %   Platelets 183  150 - 400 K/uL   DIFFERENTIAL      Result Value Ref Range   Neutrophils Relative % 80 (*) 43 - 77 %   Neutro Abs 10.8 (*) 1.7 - 7.7 K/uL   Lymphocytes Relative 10 (*) 12 - 46 %   Lymphs Abs 1.3  0.7 - 4.0 K/uL   Monocytes Relative 10  3 - 12 %   Monocytes Absolute 1.3 (*) 0.1 - 1.0 K/uL   Eosinophils Relative 0  0 - 5 %   Eosinophils Absolute 0.0  0.0 - 0.7 K/uL   Basophils Relative 0  0 -  1 %   Basophils Absolute 0.0  0.0 - 0.1 K/uL  COMPREHENSIVE METABOLIC PANEL      Result Value Ref Range   Sodium 128 (*) 137 - 147 mEq/L   Potassium 5.0  3.7 - 5.3 mEq/L   Chloride 91 (*) 96 - 112 mEq/L   CO2 24  19 - 32 mEq/L   Glucose, Bld 131 (*) 70 - 99 mg/dL   BUN 9  6 - 23 mg/dL   Creatinine, Ser 4.40  0.50 - 1.35 mg/dL   Calcium 10.2 (*) 8.4 - 10.5 mg/dL   Total Protein 8.1  6.0 - 8.3 g/dL   Albumin 3.8  3.5 - 5.2 g/dL   AST 72 (*) 0 - 37 U/L   ALT 29  0 - 53 U/L   Alkaline Phosphatase 48  39 - 117 U/L   Total Bilirubin 1.0  0.3 - 1.2 mg/dL   GFR calc non Af Amer 82 (*) >90 mL/min   GFR calc Af Amer >90  >90 mL/min   Anion gap 13  5 - 15  CK      Result Value Ref Range   Total CK 2587 (*) 7 - 232 U/L  VALPROIC ACID LEVEL      Result Value Ref Range   Valproic Acid Lvl 44.8 (*) 50.0 - 100.0 ug/mL  I-STAT CHEM 8, ED      Result Value Ref Range   Sodium 128 (*) 137 - 147 mEq/L   Potassium 4.7  3.7 - 5.3 mEq/L   Chloride 94 (*) 96 - 112 mEq/L   BUN 8  6 - 23 mg/dL   Creatinine, Ser 7.25  0.50 - 1.35 mg/dL   Glucose, Bld 366 (*) 70 - 99 mg/dL   Calcium, Ion 4.40 (*) 1.13 - 1.30 mmol/L   TCO2 26  0 - 100 mmol/L   Hemoglobin 16.0  13.0 - 17.0 g/dL   HCT 34.7  42.5 - 95.6 %  I-STAT TROPOININ, ED      Result Value Ref Range   Troponin i, poc 0.01  0.00 - 0.08 ng/mL   Comment 3           I-STAT CG4 LACTIC ACID, ED      Result Value Ref Range   Lactic Acid, Venous 2.52 (*) 0.5 - 2.2 mmol/L  I-STAT VENOUS BLOOD GAS, ED      Result Value Ref Range   pH, Ven 7.439 (*) 7.250 - 7.300   pCO2, Ven  40.1 (*) 45.0 - 50.0 mmHg   pO2, Ven 26.0 (*) 30.0 - 45.0 mmHg   Bicarbonate 27.1 (*) 20.0 - 24.0 mEq/L   TCO2 28  0 - 100 mmol/L   O2 Saturation 50.0     Acid-Base Excess 3.0 (*) 0.0 - 2.0 mmol/L   Sample type VENOUS     Comment NOTIFIED PHYSICIAN     Ct Head Wo Contrast  11/17/2013   CLINICAL DATA:  Altered mental status.  Unresponsive  EXAM: CT HEAD WITHOUT CONTRAST  CT CERVICAL SPINE WITHOUT CONTRAST  TECHNIQUE: Multidetector CT imaging of the head and cervical spine was performed following the standard protocol without intravenous contrast. Multiplanar CT image reconstructions of the cervical spine were also generated.  COMPARISON:  CT head 11/16/2009  FINDINGS: CT HEAD FINDINGS  Large area of acute hemorrhage in the right basal ganglia. Epicenter in the lateral basal ganglia/ external capsule. The hematoma measures 35 x 62 mm. There is some low-density serum or edema  anterior to the hematoma. There is mass-effect or with 5 mm midline shift to the left. No subdural or subarachnoid hemorrhage is identified.  Generalized atrophy. Ventricle size is normal. Right lateral ventricle is compressed by the hematoma.  CT CERVICAL SPINE FINDINGS  Normal cervical alignment. Diffuse cervical disc degeneration and spondylosis of a mild-to-moderate degree. Mild to moderate facet degeneration is present throughout the cervical spine.  Negative for fracture.  IMPRESSION: 35 x 62 mm hematoma right basal ganglia. This most likely is due to hypertension. There is local mass-effect.  Cervical degenerative change.  Negative for acute fracture.  Critical Value/emergent results were called by telephone at the time of interpretation on 11/17/2013 at 6:45 pm to Dr. Orlie Dakin , who verbally acknowledged these results.   Electronically Signed   By: Franchot Gallo M.D.   On: 11/17/2013 18:45   Ct Cervical Spine Wo Contrast  11/17/2013   CLINICAL DATA:  Altered mental status.  Unresponsive  EXAM: CT HEAD WITHOUT CONTRAST  CT  CERVICAL SPINE WITHOUT CONTRAST  TECHNIQUE: Multidetector CT imaging of the head and cervical spine was performed following the standard protocol without intravenous contrast. Multiplanar CT image reconstructions of the cervical spine were also generated.  COMPARISON:  CT head 11/16/2009  FINDINGS: CT HEAD FINDINGS  Large area of acute hemorrhage in the right basal ganglia. Epicenter in the lateral basal ganglia/ external capsule. The hematoma measures 35 x 62 mm. There is some low-density serum or edema anterior to the hematoma. There is mass-effect or with 5 mm midline shift to the left. No subdural or subarachnoid hemorrhage is identified.  Generalized atrophy. Ventricle size is normal. Right lateral ventricle is compressed by the hematoma.  CT CERVICAL SPINE FINDINGS  Normal cervical alignment. Diffuse cervical disc degeneration and spondylosis of a mild-to-moderate degree. Mild to moderate facet degeneration is present throughout the cervical spine.  Negative for fracture.  IMPRESSION: 35 x 62 mm hematoma right basal ganglia. This most likely is due to hypertension. There is local mass-effect.  Cervical degenerative change.  Negative for acute fracture.  Critical Value/emergent results were called by telephone at the time of interpretation on 11/17/2013 at 6:45 pm to Dr. Orlie Dakin , who verbally acknowledged these results.   Electronically Signed   By: Franchot Gallo M.D.   On: 11/17/2013 18:45    I spoke with Dr. Elsworth Soho, and intensivist who will consult on patient. I also consulted neurology Dr. Aram Beecham MDM  Plan admit intensive care unit Diagnosis #1 acute hemorrhagic stroke Final diagnoses:  None  #2 hyponatremia #3hyperglycemia #4 rhabdomyolsis CRITICAL CARE Performed by: Orlie Dakin Total critical care time: 45 minute Critical care time was exclusive of separately billable procedures and treating other patients. Critical care was necessary to treat or prevent imminent or  life-threatening deterioration. Critical care was time spent personally by me on the following activities: development of treatment plan with patient and/or surrogate as well as nursing, discussions with consultants, evaluation of patient's response to treatment, examination of patient, obtaining history from patient or surrogate, ordering and performing treatments and interventions, ordering and review of laboratory studies, ordering and review of radiographic studies, pulse oximetry and re-evaluation of patient's condition.    Orlie Dakin, MD 11/17/13 1956  Orlie Dakin, MD 11/17/13 [phone]

## 2013-11-17 NOTE — H&P (Signed)
PULMONARY / CRITICAL CARE MEDICINE   Name: Steven Garcia MRN: [id number] DOB: 12/08/1928    ADMISSION DATE:  11/17/2013 CONSULTATION DATE:  11/17/2013  REFERRING MD :  EDP  CHIEF COMPLAINT:  ICH  INITIAL PRESENTATION: 80 yo brought to ED after being found unresponsive by family members, last seen normal 24 hours ago.  In ED found to have large right basal ganglia hemorrhage.  STUDIES:  8/3 CT Head >>> 35 x 64mm hematoma in R basal ganglia. 8/4 CT Head >>> 8/4 SLP Eval >>>  SIGNIFICANT EVENTS:  The patient is encephalopathic and unable to provide history, which was obtained for available medical records.  HISTORY OF PRESENT ILLNESS: Steven Garcia is an 80 y.o. M with PMH of DM, HTN, Hyperlipidemia, IBS, GERD, Bipolar d/o.  He was brought to ED on 8/3 via EMS after being found unresponsive by family member.  Per pt's daughter, he was last seen normal 24 hours ago.  When found today, he was unable to move left side or speak clearly.  In ED, Head CT revealed large Right basal ganglia hematoma / ICH.  Denies recent trauma.  Not on any anticoags. PCCM was consulted for admission.  PAST MEDICAL HISTORY :  Past Medical History  Diagnosis Date  . Diabetes     with proteinuria  . Hypercholesteremia   . Arthritis   . IBS (irritable bowel syndrome)   . GERD (gastroesophageal reflux disease)   . Bipolar disorder    Past Surgical History  Procedure Laterality Date  . Cholecystectomy    . Hemorroidectomy    . Cataract extraction, bilateral     Prior to Admission medications   Medication Sig Start Date End Date Taking? Authorizing Provider  ACCU-CHEK AVIVA PLUS test strip  06/04/13   Historical Provider, MD  ACCU-CHEK SOFTCLIX LANCETS lancets  06/04/13   Historical Provider, MD  acetaminophen-codeine (TYLENOL #3) 300-30 MG per tablet  05/27/13   Historical Provider, MD  Alum & Mag Hydroxide-Simeth (MAGIC MOUTHWASH) SOLN Take 5 mLs by mouth.    Historical Provider, MD  amoxicillin (AMOXIL)  875 MG tablet  05/27/13   Historical Provider, MD  aspirin 81 MG tablet Take 81 mg by mouth daily.    Historical Provider, MD  Blood Glucose Monitoring Suppl (ACCU-CHEK AVIVA PLUS) W/DEVICE KIT  06/04/13   Historical Provider, MD  chlorhexidine (PERIDEX) 0.12 % solution  06/20/13   Historical Provider, MD  clindamycin (CLEOCIN) 150 MG capsule  06/20/13   Historical Provider, MD  divalproex (DEPAKOTE) 500 MG DR tablet Take 500 mg by mouth 3 (three) times daily.    Historical Provider, MD  glipiZIDE (GLUCOTROL) 5 MG tablet Take by mouth daily before breakfast.    Historical Provider, MD  lisinopril (PRINIVIL,ZESTRIL) 20 MG tablet Take 20 mg by mouth daily.    Historical Provider, MD  lovastatin (MEVACOR) 20 MG tablet Take 20 mg by mouth at bedtime.    Historical Provider, MD  metformin (FORTAMET) 500 MG (OSM) 24 hr tablet Take 500 mg by mouth 2 (two) times daily with a meal.    Historical Provider, MD  Omega-3 Fatty Acids (FISH OIL) 1000 MG CAPS Take by mouth daily.    Historical Provider, MD  omeprazole (PRILOSEC) 20 MG capsule Take 20 mg by mouth daily.    Historical Provider, MD   Allergies  Allergen Reactions  . Codeine Phosphate Other (See Comments)    Upset stomach  . Lisinopril Cough  . Protonix [Pantoprazole Sodium] Diarrhea  . Seroquel [  Quetiapine Fumarate] Other (See Comments)    GI side effects   FAMILY HISTORY:  History reviewed. No pertinent family history.  SOCIAL HISTORY:  reports that he has quit smoking. He does not have any smokeless tobacco history on file. He reports that he drinks alcohol. He reports that he does not use illicit drugs.  REVIEW OF SYSTEMS:  Unable to obtain  INTERVAL HISTORY:   VITAL SIGNS: Temp:  [96.9 F (36.1 C)] 96.9 F (36.1 C) (08/03 1708) Pulse Rate:  [87-100] 89 (08/03 1915) Resp:  [14-26] 19 (08/03 1915) BP: (144-181)/(66-92) 144/66 mmHg (08/03 1915) SpO2:  [97 %-100 %] 97 % (08/03 1915)  HEMODYNAMICS:   VENTILATOR SETTINGS:   INTAKE  / OUTPUT: Intake/Output     08/03 0701 - 08/04 0700   I.V. (mL/kg) 1000 (13.4)   Total Intake(mL/kg) 1000 (13.4)   Urine (mL/kg/hr) 1935   Total Output 1935   Net -935        PHYSICAL EXAMINATION: General: WDWN male, minimally responsive. Neuro: Alert to person.  Answers some questions appropriately.  Left sided weakness.  HEENT: /AT. PERRL, sclerae anicteric. Cardiovascular: RRR, no M/R/G.  Lungs: Respirations shallow.  Coarse rhonchi. Abdomen: BS x 4, soft, NT/ND.  Musculoskeletal: No gross deformities, no edema.  Skin: Intact, warm, no rashes.  LABS:  CBC  Recent Labs Lab 11/17/13 1713 11/17/13 1825  WBC 13.4*  --   HGB 14.4 16.0  HCT 40.6 47.0  PLT 183  --    Coag's  Recent Labs Lab 11/17/13 1713  APTT 29  INR 1.02   BMET  Recent Labs Lab 11/17/13 1713 11/17/13 1825  NA 128* 128*  K 5.0 4.7  CL 91* 94*  CO2 24  --   BUN 9 8  CREATININE 0.76 0.90  GLUCOSE 131* 139*   Electrolytes  Recent Labs Lab 11/17/13 1713  CALCIUM 10.9*   Sepsis Markers  Recent Labs Lab 11/17/13 1820  LATICACIDVEN 2.52*   ABG No results found for this basename: PHART, PCO2ART, PO2ART,  in the last 168 hours  Liver Enzymes  Recent Labs Lab 11/17/13 1713  AST 72*  ALT 29  ALKPHOS 48  BILITOT 1.0  ALBUMIN 3.8   Cardiac Enzymes No results found for this basename: TROPONINI, PROBNP,  in the last 168 hours  Glucose No results found for this basename: GLUCAP,  in the last 168 hours  IMAGING:  Ct Head Wo Contrast  11/17/2013   CLINICAL DATA:  Altered mental status.  Unresponsive  EXAM: CT HEAD WITHOUT CONTRAST  CT CERVICAL SPINE WITHOUT CONTRAST  TECHNIQUE: Multidetector CT imaging of the head and cervical spine was performed following the standard protocol without intravenous contrast. Multiplanar CT image reconstructions of the cervical spine were also generated.  COMPARISON:  CT head 11/16/2009  FINDINGS: CT HEAD FINDINGS  Large area of acute hemorrhage  in the right basal ganglia. Epicenter in the lateral basal ganglia/ external capsule. The hematoma measures 35 x 62 mm. There is some low-density serum or edema anterior to the hematoma. There is mass-effect or with 5 mm midline shift to the left. No subdural or subarachnoid hemorrhage is identified.  Generalized atrophy. Ventricle size is normal. Right lateral ventricle is compressed by the hematoma.  CT CERVICAL SPINE FINDINGS  Normal cervical alignment. Diffuse cervical disc degeneration and spondylosis of a mild-to-moderate degree. Mild to moderate facet degeneration is present throughout the cervical spine.  Negative for fracture.  IMPRESSION: 35 x 62 mm hematoma right basal ganglia.  This most likely is due to hypertension. There is local mass-effect.  Cervical degenerative change.  Negative for acute fracture.  Critical Value/emergent results were called by telephone at the time of interpretation on 11/17/2013 at 6:45 pm to Dr. Orlie Dakin , who verbally acknowledged these results.   Electronically Signed   By: Franchot Gallo M.D.   On: 11/17/2013 18:45   Ct Cervical Spine Wo Contrast  11/17/2013   CLINICAL DATA:  Altered mental status.  Unresponsive  EXAM: CT HEAD WITHOUT CONTRAST  CT CERVICAL SPINE WITHOUT CONTRAST  TECHNIQUE: Multidetector CT imaging of the head and cervical spine was performed following the standard protocol without intravenous contrast. Multiplanar CT image reconstructions of the cervical spine were also generated.  COMPARISON:  CT head 11/16/2009  FINDINGS: CT HEAD FINDINGS  Large area of acute hemorrhage in the right basal ganglia. Epicenter in the lateral basal ganglia/ external capsule. The hematoma measures 35 x 62 mm. There is some low-density serum or edema anterior to the hematoma. There is mass-effect or with 5 mm midline shift to the left. No subdural or subarachnoid hemorrhage is identified.  Generalized atrophy. Ventricle size is normal. Right lateral ventricle is  compressed by the hematoma.  CT CERVICAL SPINE FINDINGS  Normal cervical alignment. Diffuse cervical disc degeneration and spondylosis of a mild-to-moderate degree. Mild to moderate facet degeneration is present throughout the cervical spine.  Negative for fracture.  IMPRESSION: 35 x 62 mm hematoma right basal ganglia. This most likely is due to hypertension. There is local mass-effect.  Cervical degenerative change.  Negative for acute fracture.  Critical Value/emergent results were called by telephone at the time of interpretation on 11/17/2013 at 6:45 pm to Dr. Orlie Dakin , who verbally acknowledged these results.   Electronically Signed   By: Franchot Gallo M.D.   On: 11/17/2013 18:45   ASSESSMENT / PLAN:  PULMONARY A: Protecting airway At risk for intubation P:   Goal SpO2>92 Supplemental oxygen PRN  CARDIOVASCULAR A:  HTN HLD P:  Goal SBP < 160 per Neurology Hold outpatient Lisinopril, Lovastatin Labetalol PRN  RENAL A:  Hyponatremia P:   Trend BMP NS @ 100  GASTROINTESTINAL A:  GERD on PPI Nutrition At risk for aspiration P:   Pantoprazole NPO SLP eval in am  HEMATOLOGIC A:  VTE Px P:  Trend CBC  INFECTIOUS A:   Reactive leucocytosis  P:   Defer abx  ENDOCRINE A:   DM  P:   Hold outpatient Metformin. SSI  NEUROLOGIC A:   Large right basal ganglia hemarrhage Bipolar disorder P:   Neurology following Continue outpatient Depakote F/u head CT in am  Montey Hora, [facility] Pgr: [phone]  or [phone]  11/17/2013, 7:30 PM  I have personally obtained history, examined patient, evaluated and interpreted laboratory and imaging results, reviewed medical records, formulated assessment / plan and placed orders.  CRITICAL CARE:  The patient is critically ill with multiple organ systems failure and requires high complexity decision making for assessment and support, frequent evaluation and  titration of therapies, application of advanced monitoring technologies and extensive interpretation of multiple databases. Critical Care Time devoted to patient care services described in this note is 45 minutes.   Doree Fudge, MD [facility] and [place] Pager: [phone]  11/17/2013, 10:50 PM

## 2013-11-17 NOTE — ED Notes (Signed)
Patient transported to CT 

## 2013-11-17 NOTE — ED Notes (Signed)
[facility]. Found unresponsive by family at residence at [id number]. LSN yesterday 1730. Left side flaccid. Breathing sponatneous

## 2013-11-17 NOTE — Consult Note (Signed)
Referring Physician: ED    Chief Complaint: altered mental status with left hemiplegia  HPI:                                                                                                                                         GASTON DASE is an 80 y.o. male, right handed, with a past medical history significant for DM, hypercholesterolemia, bipolar disorder, IBS, GERD, brought in by EMS after being by family unresponsive and unable to move the left side around 1645 today. Last seen normal 1730 yesterday. SBP 180 upon arrival to the ED. CT brain revealed a 35 x 62 mm hematoma right basal ganglia with mass effect and 5 mm midline shift. No recent trauma. No reported use of anticoagulants. INR 1.02, PTT 29, platelets 183 At this moment he is obtunded, answers some questions, but is nit really following commands, with SBP 144. Date last known well: 11/16/13 Time last known well: 1730 tPA Given: no, ICH   Past Medical History  Diagnosis Date  . Diabetes     with proteinuria  . Hypercholesteremia   . Arthritis   . IBS (irritable bowel syndrome)   . GERD (gastroesophageal reflux disease)   . Bipolar disorder     Past Surgical History  Procedure Laterality Date  . Cholecystectomy    . Hemorroidectomy    . Cataract extraction, bilateral      History reviewed. No pertinent family history. Social History:  reports that he has quit smoking. He does not have any smokeless tobacco history on file. He reports that he drinks alcohol. He reports that he does not use illicit drugs.  Allergies:  Allergies  Allergen Reactions  . Codeine Phosphate Other (See Comments)    Upset stomach  . Lisinopril Cough  . Protonix [Pantoprazole Sodium] Diarrhea  . Seroquel [Quetiapine Fumarate] Other (See Comments)    GI side effects    Medications:                                                                                                                           I have reviewed the patient's  current medications.  ROS:  Unable to obtain due to mental status.  History obtained from chart review  Physical exam: pleasant male in no apparent distress.Blood pressure 144/66, pulse 89, temperature 96.9 F (36.1 C), temperature source Rectal, resp. rate 19, SpO2 97.00%. Head: normocephalic. Neck: supple, no bruits, no JVD. Cardiac: no murmurs. Lungs: clear. Abdomen: soft, no tender, no mass. Extremities: no edema.  Neurologic Examination:                                                                                                          Results for orders placed during the hospital encounter of 11/17/13 (from the past 48 hour(s))  ETHANOL     Status: None   Collection Time    11/17/13  5:13 PM      Result Value Ref Range   Alcohol, Ethyl (B) <11  0 - 11 mg/dL   Comment:            LOWEST DETECTABLE LIMIT FOR     SERUM ALCOHOL IS 11 mg/dL     FOR MEDICAL PURPOSES ONLY  PROTIME-INR     Status: None   Collection Time    11/17/13  5:13 PM      Result Value Ref Range   Prothrombin Time 13.4  11.6 - 15.2 seconds   INR 1.02  0.00 - 1.49  APTT     Status: None   Collection Time    11/17/13  5:13 PM      Result Value Ref Range   aPTT 29  24 - 37 seconds  CBC     Status: Abnormal   Collection Time    11/17/13  5:13 PM      Result Value Ref Range   WBC 13.4 (*) 4.0 - 10.5 K/uL   RBC 4.66  4.22 - 5.81 MIL/uL   Hemoglobin 14.4  13.0 - 17.0 g/dL   HCT 79.8  48.2 - 14.7 %   MCV 87.1  78.0 - 100.0 fL   MCH 30.9  26.0 - 34.0 pg   MCHC 35.5  30.0 - 36.0 g/dL   RDW 72.6  77.1 - 37.0 %   Platelets 183  150 - 400 K/uL  DIFFERENTIAL     Status: Abnormal   Collection Time    11/17/13  5:13 PM      Result Value Ref Range   Neutrophils Relative % 80 (*) 43 - 77 %   Neutro Abs 10.8 (*) 1.7 - 7.7 K/uL   Lymphocytes Relative 10 (*) 12 - 46 %   Lymphs Abs 1.3  0.7  - 4.0 K/uL   Monocytes Relative 10  3 - 12 %   Monocytes Absolute 1.3 (*) 0.1 - 1.0 K/uL   Eosinophils Relative 0  0 - 5 %   Eosinophils Absolute 0.0  0.0 - 0.7 K/uL   Basophils Relative 0  0 - 1 %   Basophils Absolute 0.0  0.0 - 0.1 K/uL  COMPREHENSIVE METABOLIC PANEL     Status: Abnormal   Collection Time    11/17/13  5:13 PM      Result Value  Ref Range   Sodium 128 (*) 137 - 147 mEq/L   Potassium 5.0  3.7 - 5.3 mEq/L   Chloride 91 (*) 96 - 112 mEq/L   CO2 24  19 - 32 mEq/L   Glucose, Bld 131 (*) 70 - 99 mg/dL   BUN 9  6 - 23 mg/dL   Creatinine, Ser 0.76  0.50 - 1.35 mg/dL   Calcium 10.9 (*) 8.4 - 10.5 mg/dL   Total Protein 8.1  6.0 - 8.3 g/dL   Albumin 3.8  3.5 - 5.2 g/dL   AST 72 (*) 0 - 37 U/L   ALT 29  0 - 53 U/L   Alkaline Phosphatase 48  39 - 117 U/L   Total Bilirubin 1.0  0.3 - 1.2 mg/dL   GFR calc non Af Amer 82 (*) >90 mL/min   GFR calc Af Amer >90  >90 mL/min   Comment: (NOTE)     The eGFR has been calculated using the CKD EPI equation.     This calculation has not been validated in all clinical situations.     eGFR's persistently <90 mL/min signify possible Chronic Kidney     Disease.   Anion gap 13  5 - 15  CK     Status: Abnormal   Collection Time    11/17/13  5:13 PM      Result Value Ref Range   Total CK 2587 (*) 7 - 232 U/L  VALPROIC ACID LEVEL     Status: Abnormal   Collection Time    11/17/13  5:13 PM      Result Value Ref Range   Valproic Acid Lvl 44.8 (*) 50.0 - 100.0 ug/mL  I-STAT TROPOININ, ED     Status: None   Collection Time    11/17/13  6:19 PM      Result Value Ref Range   Troponin i, poc 0.01  0.00 - 0.08 ng/mL   Comment 3            Comment: Due to the release kinetics of cTnI,     a negative result within the first hours     of the onset of symptoms does not rule out     myocardial infarction with certainty.     If myocardial infarction is still suspected,     repeat the test at appropriate intervals.  I-STAT VENOUS BLOOD GAS, ED      Status: Abnormal   Collection Time    11/17/13  6:19 PM      Result Value Ref Range   pH, Ven 7.439 (*) 7.250 - 7.300   pCO2, Ven 40.1 (*) 45.0 - 50.0 mmHg   pO2, Ven 26.0 (*) 30.0 - 45.0 mmHg   Bicarbonate 27.1 (*) 20.0 - 24.0 mEq/L   TCO2 28  0 - 100 mmol/L   O2 Saturation 50.0     Acid-Base Excess 3.0 (*) 0.0 - 2.0 mmol/L   Sample type VENOUS     Comment NOTIFIED PHYSICIAN    I-STAT CG4 LACTIC ACID, ED     Status: Abnormal   Collection Time    11/17/13  6:20 PM      Result Value Ref Range   Lactic Acid, Venous 2.52 (*) 0.5 - 2.2 mmol/L  I-STAT CHEM 8, ED     Status: Abnormal   Collection Time    11/17/13  6:25 PM      Result Value Ref Range   Sodium 128 (*) 137 - 147  mEq/L   Potassium 4.7  3.7 - 5.3 mEq/L   Chloride 94 (*) 96 - 112 mEq/L   BUN 8  6 - 23 mg/dL   Creatinine, Ser 0.90  0.50 - 1.35 mg/dL   Glucose, Bld 139 (*) 70 - 99 mg/dL   Calcium, Ion 1.35 (*) 1.13 - 1.30 mmol/L   TCO2 26  0 - 100 mmol/L   Hemoglobin 16.0  13.0 - 17.0 g/dL   HCT 47.0  39.0 - 52.0 %   Ct Head Wo Contrast  11/17/2013   CLINICAL DATA:  Altered mental status.  Unresponsive  EXAM: CT HEAD WITHOUT CONTRAST  CT CERVICAL SPINE WITHOUT CONTRAST  TECHNIQUE: Multidetector CT imaging of the head and cervical spine was performed following the standard protocol without intravenous contrast. Multiplanar CT image reconstructions of the cervical spine were also generated.  COMPARISON:  CT head 11/16/2009  FINDINGS: CT HEAD FINDINGS  Large area of acute hemorrhage in the right basal ganglia. Epicenter in the lateral basal ganglia/ external capsule. The hematoma measures 35 x 62 mm. There is some low-density serum or edema anterior to the hematoma. There is mass-effect or with 5 mm midline shift to the left. No subdural or subarachnoid hemorrhage is identified.  Generalized atrophy. Ventricle size is normal. Right lateral ventricle is compressed by the hematoma.  CT CERVICAL SPINE FINDINGS  Normal cervical  alignment. Diffuse cervical disc degeneration and spondylosis of a mild-to-moderate degree. Mild to moderate facet degeneration is present throughout the cervical spine.  Negative for fracture.  IMPRESSION: 35 x 62 mm hematoma right basal ganglia. This most likely is due to hypertension. There is local mass-effect.  Cervical degenerative change.  Negative for acute fracture.  Critical Value/emergent results were called by telephone at the time of interpretation on 11/17/2013 at 6:45 pm to Dr. Orlie Dakin , who verbally acknowledged these results.   Electronically Signed   By: Franchot Gallo M.D.   On: 11/17/2013 18:45   Ct Cervical Spine Wo Contrast  11/17/2013   CLINICAL DATA:  Altered mental status.  Unresponsive  EXAM: CT HEAD WITHOUT CONTRAST  CT CERVICAL SPINE WITHOUT CONTRAST  TECHNIQUE: Multidetector CT imaging of the head and cervical spine was performed following the standard protocol without intravenous contrast. Multiplanar CT image reconstructions of the cervical spine were also generated.  COMPARISON:  CT head 11/16/2009  FINDINGS: CT HEAD FINDINGS  Large area of acute hemorrhage in the right basal ganglia. Epicenter in the lateral basal ganglia/ external capsule. The hematoma measures 35 x 62 mm. There is some low-density serum or edema anterior to the hematoma. There is mass-effect or with 5 mm midline shift to the left. No subdural or subarachnoid hemorrhage is identified.  Generalized atrophy. Ventricle size is normal. Right lateral ventricle is compressed by the hematoma.  CT CERVICAL SPINE FINDINGS  Normal cervical alignment. Diffuse cervical disc degeneration and spondylosis of a mild-to-moderate degree. Mild to moderate facet degeneration is present throughout the cervical spine.  Negative for fracture.  IMPRESSION: 35 x 62 mm hematoma right basal ganglia. This most likely is due to hypertension. There is local mass-effect.  Cervical degenerative change.  Negative for acute fracture.   Critical Value/emergent results were called by telephone at the time of interpretation on 11/17/2013 at 6:45 pm to Dr. Orlie Dakin , who verbally acknowledged these results.   Electronically Signed   By: Franchot Gallo M.D.   On: 11/17/2013 18:45     Assessment: 80 y.o. male brought in  by EMS after being by family unresponsive and unable to move the left side around 1645 today. CT brain revealed a 35 x 62 mm hematoma right basal ganglia with mass effect and 5 mm midline shift.  He is obtunded with right HP. Location of the [place] favors a hypertensive etiology. Recommend: 1) Maintain goal SBP below 160. 2) Follow up CT brain in am. 3) Mild midline shift and thus will try osmotherapy with mannitol. Keep serum osmolality below 320. 3) Avoid aspirin or anticoagulants. 4) ICU management as per critical care attending. Patient seems to be protecting his airway at this moment. 5) Stroke team will follow up in am. Family at bedside was updated.  Dorian Pod, MD [facility] [phone]  11/17/2013, 7:25 PM

## 2013-11-17 NOTE — ED Notes (Signed)
Per Dr Dione BoozeJacobawitz do not want indwelling catheter.

## 2013-11-17 NOTE — ED Notes (Signed)
Result for venous blood gas and CG4+ Lactic Acid given to Dr. Ethelda ChickJacubowitz

## 2013-11-18 ENCOUNTER — Inpatient Hospital Stay (HOSPITAL_COMMUNITY): Payer: Medicare Other

## 2013-11-18 ENCOUNTER — Encounter (HOSPITAL_COMMUNITY): Payer: Self-pay | Admitting: *Deleted

## 2013-11-18 DIAGNOSIS — I10 Essential (primary) hypertension: Secondary | ICD-10-CM

## 2013-11-18 LAB — BASIC METABOLIC PANEL
Anion gap: 12 (ref 5–15)
BUN: 9 mg/dL (ref 6–23)
CALCIUM: 10.6 mg/dL — AB (ref 8.4–10.5)
CO2: 24 meq/L (ref 19–32)
CREATININE: 0.93 mg/dL (ref 0.50–1.35)
Chloride: 96 mEq/L (ref 96–112)
GFR calc Af Amer: 88 mL/min — ABNORMAL LOW (ref >90)
GFR, EST NON AFRICAN AMERICAN: 76 mL/min — AB (ref >90)
Glucose, Bld: 104 mg/dL — ABNORMAL HIGH (ref 70–99)
Potassium: 4.4 mEq/L (ref 3.7–5.3)
SODIUM: 132 meq/L — AB (ref 137–147)

## 2013-11-18 LAB — CBC
HEMATOCRIT: 38.2 % — AB (ref 39.0–52.0)
HEMOGLOBIN: 13.5 g/dL (ref 13.0–17.0)
MCH: 31.6 pg (ref 26.0–34.0)
MCHC: 35.3 g/dL (ref 30.0–36.0)
MCV: 89.5 fL (ref 78.0–100.0)
Platelets: 175 10*3/uL (ref 150–400)
RBC: 4.27 MIL/uL (ref 4.22–5.81)
RDW: 13.3 % (ref 11.5–15.5)
WBC: 10.5 10*3/uL (ref 4.0–10.5)

## 2013-11-18 LAB — MRSA PCR SCREENING: MRSA BY PCR: NEGATIVE

## 2013-11-18 LAB — GLUCOSE, CAPILLARY
GLUCOSE-CAPILLARY: 101 mg/dL — AB (ref 70–99)
GLUCOSE-CAPILLARY: 116 mg/dL — AB (ref 70–99)
GLUCOSE-CAPILLARY: 120 mg/dL — AB (ref 70–99)
Glucose-Capillary: 114 mg/dL — ABNORMAL HIGH (ref 70–99)
Glucose-Capillary: 101 mg/dL — ABNORMAL HIGH (ref 70–99)
Glucose-Capillary: 137 mg/dL — ABNORMAL HIGH (ref 70–99)

## 2013-11-18 LAB — PHOSPHORUS: Phosphorus: 1.9 mg/dL — ABNORMAL LOW (ref 2.3–4.6)

## 2013-11-18 LAB — MAGNESIUM: Magnesium: 1.5 mg/dL (ref 1.5–2.5)

## 2013-11-18 LAB — OSMOLALITY: Osmolality: 283 mOsm/kg (ref 275–300)

## 2013-11-18 MED ORDER — CETYLPYRIDINIUM CHLORIDE 0.05 % MT LIQD
7.0000 mL | OROMUCOSAL | Status: DC
  Administered 2013-11-18 – 2013-11-21 (×18): 7 mL via OROMUCOSAL

## 2013-11-18 NOTE — Progress Notes (Signed)
PT Cancellation Note  Patient Details Name: Roxana Hiresugene E Mullarkey MRN: [id number] DOB: 09/24/1928   Cancelled Treatment:    Reason Eval/Treat Not Completed: Patient not medically ready (active bedrest orders at this time, will see once updated )   [name], Micole Delehanty J 11/18/2013, 8:10 AM Charlotte Crumbevon Damico Partin, PT DPT  [phone]

## 2013-11-18 NOTE — Progress Notes (Signed)
Stroke Team Progress Note   Admit Date: 11/17/2013  LOS: 1 day   HISTORY Roxana Hiresugene E Lopp is an 80 y.o. male, right handed, with a past medical history significant for DM, hypercholesterolemia, bipolar disorder, IBS, GERD, brought in by EMS after being by family unresponsive and unable to move the left side around 1645 today. Last seen normal 1730 yesterday.  SBP 180 upon arrival to the ED.  CT brain revealed a 35 x 62 mm hematoma right basal ganglia with mass effect and 5 mm midline shift.  No recent trauma. No reported use of anticoagulants.  INR 1.02, PTT 29, platelets 183  At this moment he is obtunded, answers some questions, but is nit really following commands, with SBP 144.  Date last known well: 11/16/13  Time last known well: 1730  tPA Given: no, ICH He was admitted to the neuro ICU for further evaluation and treatment.  SUBJECTIVE  daughter is at the bedside. Pt lethargic not able to open eyes but able to answer some simple questions, slurry speech. Overnight BP < 140. Repeat CT ordered this am.  OBJECTIVE Most recent Vital Signs: Filed Vitals:   11/18/13 0700 11/18/13 0800 11/18/13 0900 11/18/13 1000  BP: 114/62 136/71 143/66 141/66  Pulse: 81 86 78 81  Temp:  98.7 F (37.1 C)    TempSrc:  Oral    Resp: 16 23 19 20   Height:      Weight:      SpO2: 100% 100% 100% 96%   CBG (last 3)   Recent Labs  11/18/13 0002 11/18/13 0414 11/18/13 0727  GLUCAP 101* 116* 114*     IV Fluid Intake:   . sodium chloride 100 mL/hr at 11/17/13 2205    Medications: Scheduled:  . antiseptic oral rinse  7 mL Mouth Rinse 6 times per day  . insulin aspart  0-15 Units Subcutaneous 6 times per day  . mannitol  0.25 g/kg Intravenous TID  . pantoprazole (PROTONIX) IV  40 mg Intravenous QHS  . senna-docusate  1 tablet Oral BID  . valproate sodium  500 mg Intravenous 3 times per day   Continuous Infusions:  . sodium chloride 100 mL/hr at 11/17/13 2205   PRN: @MEDSPRNSH @  Diet:  NPO   Activity:  Bedrest DVT Prophylaxis:  SCDs  CLINICALLY SIGNIFICANT STUDIES Basic Metabolic Panel:  Recent Labs Lab 11/17/13 1713 11/17/13 1825 11/18/13 0218  NA 128* 128* 132*  K 5.0 4.7 4.4  CL 91* 94* 96  CO2 24  --  24  GLUCOSE 131* 139* 104*  BUN 9 8 9   CREATININE 0.76 0.90 0.93  CALCIUM 10.9*  --  10.6*  MG  --   --  1.5  PHOS  --   --  1.9*   Liver Function Tests:  Recent Labs Lab 11/17/13 1713  AST 72*  ALT 29  ALKPHOS 48  BILITOT 1.0  PROT 8.1  ALBUMIN 3.8   CBC:  Recent Labs Lab 11/17/13 1713 11/17/13 1825 11/18/13 0218  WBC 13.4*  --  10.5  NEUTROABS 10.8*  --   --   HGB 14.4 16.0 13.5  HCT 40.6 47.0 38.2*  MCV 87.1  --  89.5  PLT 183  --  175   Coagulation:  Recent Labs Lab 11/17/13 1713  LABPROT 13.4  INR 1.02   Cardiac Enzymes:  Recent Labs Lab 11/17/13 1713  CKTOTAL 2587*   Urinalysis:  Recent Labs Lab 11/17/13 1909  COLORURINE YELLOW  LABSPEC 1.012  PHURINE 6.5  GLUCOSEU 250*  HGBUR SMALL*  BILIRUBINUR NEGATIVE  KETONESUR 15*  PROTEINUR 100*  UROBILINOGEN 1.0  NITRITE NEGATIVE  LEUKOCYTESUR NEGATIVE   Lipid Panel No results found for this basename: chol, trig, hdl, cholhdl, vldl, ldlcalc   HgbA1C .resu Lab Results  Component Value Date   HGBA1C  Value: 7.0 (NOTE)                                                                       According to the ADA Clinical Practice Recommendations for 2011, when HbA1c is used as a screening test:   >=6.5%   Diagnostic of Diabetes Mellitus           (if abnormal result  is confirmed)  5.7-6.4%   Increased risk of developing Diabetes Mellitus  References:Diagnosis and Classification of Diabetes Mellitus,Diabetes Care,2011,34(Suppl 1):S62-S69 and Standards of Medical Care in         Diabetes - 2011,Diabetes Care,2011,34  (Suppl 1):S11-S61.* 11/17/2009    Urine Drug Screen:     Component Value Date/Time   LABOPIA NONE DETECTED 11/17/2013 1909   COCAINSCRNUR NONE DETECTED 11/17/2013 1909    LABBENZ NONE DETECTED 11/17/2013 1909   AMPHETMU NONE DETECTED 11/17/2013 1909   THCU NONE DETECTED 11/17/2013 1909   LABBARB NONE DETECTED 11/17/2013 1909    Alcohol Level:  Recent Labs Lab 11/17/13 1713  ETH <11    Ct Head Wo Contrast  11/18/2013  IMPRESSION: No increase in size in the intracerebral hematoma, likely hypertensive in origin. Mild increase in surrounding edema. Minimal increased RIGHT-to-LEFT shift now measuring 6 mm.  Findings discussed with ordering provider.   Ct Head Wo Contrast  11/17/2013   IMPRESSION: 35 x 62 mm hematoma right basal ganglia. This most likely is due to hypertension. There is local mass-effect.  Cervical degenerative change.  Negative for acute fracture.    Ct Cervical Spine Wo Contrast  11/17/2013   IMPRESSION: 35 x 62 mm hematoma right basal ganglia. This most likely is due to hypertension. There is local mass-effect.  Cervical degenerative change.  Negative for acute fracture.   EKG  Atrial flutter. For complete results please see formal report.   PT/OT Recommendations pending  Physical Exam Temp:  [96.9 F (36.1 C)-99.6 F (37.6 C)] 98.7 F (37.1 C) (08/04 0800) Pulse Rate:  [78-109] 81 (08/04 1000) Resp:  [14-28] 20 (08/04 1000) BP: (108-181)/(47-108) 141/66 mmHg (08/04 1000) SpO2:  [88 %-100 %] 96 % (08/04 1000) Weight:  [164 lb 0.4 oz (74.4 kg)-173 lb 4.5 oz (78.6 kg)] 173 lb 4.5 oz (78.6 kg) (08/04 0600)  General - Well nourished, well developed, in no apparent distress.  Ophthalmologic - not able to see through.  Cardiovascular - Regular rate and rhythm with no murmur.  Lung - bilateral coarse rhonchi  Neuro - lethargic and eyes not open, able to answer his name but not age, severe dysarthria. Pupil left 2.64mm and right 2mm, sluggish to light, right eye deviation, left facial droop, left UE and LE trace withdraw with pain stimulation, right UE and LE spontaneously movement, follow simple commands on right UE and LE. Left babinski  positive, reflex 1+ throughout.  ASSESSMENT Mr. JAYMES HANG is a 80 y.o. male with PMH  of HTN and DM found down at home. Found to have left hemiplegia. CT showed right BG big bleeding, around 64 cc with midline shift. His ICH score is 3 carrying 72% mortality within 30 days. Poor prognosis overall.    A1C 7.0  Age  HTN   TREATMENT/PLAN  Right BG bleeding  Large bleeding with 64 cc  ICH score 3 - 72% mortality within a month  BP goal < 160  Repeat CT no further bleeding  GI and DVT prophylaxis  Continue mannitol - will not do hypertonic saline or central line  I had long discussion with daughter, his health care surrogate, regarding His stroke, risk factors, prognosis, treatment plan and answered all questions. She understood the severity and critical condition, requsted DNR and DNI. I agree  Will continue to monitor mental status. May consider withdraw care if pt deteriorates as per daughter  HTN - BP stable at this moment < 160 - will keep monitoring - cardene drip if needed  DM - A1C 7.0 - keep monitoring   Elevated CK - continue monitoring  This patient is critically ill due to ICH and at significant risk of neurological worsening, death form recurrent hemorrhage, cerebral edema and brain herniation. This patient's care requires constant monitoring of vital signs, hemodynamics, respiratory and cardiac monitoring, review of multiple databases, neurological assessment, discussion with family, other specialists and medical decision making of high complexity. Had long discussion with daughter regarding his diagnosis, treatment options and prognosis. I spent 45 minutes of neurocritical care time in the care of this patient.   SIGNED Marvel Plan, MD PhD 11/18/2013 11:22 AM      To contact Stroke Continuity provider, please refer to [email or web address]. After hours, contact General Neurology

## 2013-11-18 NOTE — Plan of Care (Signed)
Problem: Acute Treatment Outcomes Goal: Prognosis discussed with family/patient as appropriate Outcome: Completed/Met Date Met:  11/18/13 Patient made DNR

## 2013-11-18 NOTE — Evaluation (Signed)
Clinical/Bedside Swallow Evaluation Patient Details  Name: Steven Garcia MRN: [id number] Date of Birth: 08/01/1928  Today's Date: 11/18/2013 Time: 1510-1520 SLP Time Calculation (min): 10 min  Past Medical History:  Past Medical History  Diagnosis Date  . Diabetes     with proteinuria  . Hypercholesteremia   . Arthritis   . IBS (irritable bowel syndrome)   . GERD (gastroesophageal reflux disease)   . Bipolar disorder    Past Surgical History:  Past Surgical History  Procedure Laterality Date  . Cholecystectomy    . Hemorroidectomy    . Cataract extraction, bilateral     HPI:  80 yo brought to ED after being found unresponsive by family member.  In ED found to have large right basal ganglia hemorrhage.  8/3 CT Head >>> 35 x 62mm hematoma in R basal ganglia.   Assessment / Plan / Recommendation Clinical Impression  Pt presents with severe neurogenic dysphagia: poor bolus anticipation and recognition, inability to manipulate material orally with spillage from left side; eventual swallow response followed by immediate wet cough, suggestive of probable aspiration.  Pt with difficulty sustaining arousal.  Recommend continuing NPO status for now; consider temporary enteral feeding.       Aspiration Risk  Severe    Diet Recommendation NPO;Alternative means - temporary        Other  Recommendations Oral Care Recommendations:  (QID)   Follow Up Recommendations       Frequency and Duration min 3x week  2 weeks     Swallow Study Prior Functional Status       General Date of Onset: 11/18/13 HPI: 80 yo brought to ED after being found unresponsive by family member.  In ED found to have large right basal ganglia hemorrhage.  8/3 CT Head >>> 35 x 62mm hematoma in R basal ganglia. Type of Study: Bedside swallow evaluation Previous Swallow Assessment: none per records Diet Prior to this Study: NPO Temperature Spikes Noted: No Respiratory Status: Nasal cannula History of Recent  Intubation: No Behavior/Cognition: Lethargic Oral Cavity - Dentition: Missing dentition Self-Feeding Abilities: Total assist Patient Positioning: Partially reclined Baseline Vocal Quality: Hoarse;Wet Volitional Cough: Weak Volitional Swallow: Able to elicit    Oral/Motor/Sensory Function Overall Oral Motor/Sensory Function: Impaired (signifcant CN deficit VII, XII on left)   Ice Chips Ice chips: Impaired Presentation: Spoon Oral Phase Impairments: Reduced labial seal;Reduced lingual movement/coordination;Impaired mastication;Poor awareness of bolus Oral Phase Functional Implications: Oral residue;Prolonged oral transit   Thin Liquid Thin Liquid: Impaired Presentation: Spoon Oral Phase Impairments: Reduced labial seal;Reduced lingual movement/coordination Oral Phase Functional Implications: Left anterior spillage;Prolonged oral transit;Oral residue    Nectar Thick Nectar Thick Liquid: Not tested   Honey Thick Honey Thick Liquid: Not tested   Puree Puree: Not tested   Solid  Steven Garcia, [place] CCC/SLP Pager [phone]     Solid: Not tested       Steven Garcia, Steven Garcia 11/18/2013,3:51 PM

## 2013-11-18 NOTE — Evaluation (Signed)
Speech Language Pathology Evaluation Patient Details Name: Steven Garcia MRN: [id number] DOB: 10/21/1928 Today's Date: 11/18/2013 Time: 1520-1530 SLP Time Calculation (min): 10 min  Problem List:  Patient Active Problem List   Diagnosis Date Noted  . Stroke due to intracerebral hemorrhage 11/17/2013  . ICH (intracerebral hemorrhage) 11/17/2013   Past Medical History:  Past Medical History  Diagnosis Date  . Diabetes     with proteinuria  . Hypercholesteremia   . Arthritis   . IBS (irritable bowel syndrome)   . GERD (gastroesophageal reflux disease)   . Bipolar disorder    Past Surgical History:  Past Surgical History  Procedure Laterality Date  . Cholecystectomy    . Hemorroidectomy    . Cataract extraction, bilateral     HPI:  80 yo brought to ED after being found unresponsive by family member.  In ED found to have large right basal ganglia hemorrhage.  8/3 CT Head >>> 35 x 62mm hematoma in R basal ganglia.   Assessment / Plan / Recommendation Clinical Impression  Pt presents with mod-severe dysarthria of speech; lethargic today with ability to arouse for brief periods to answer biographical questions, follow simple commands.  Deficits in orientation, attention, recall present.  SLP to follow for basic cognition, speech, and dysphagia.      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[employer])   Cognition  Overall Cognitive Status: Impaired/Different from baseline Arousal/Alertness: Lethargic Orientation  Level: Oriented to person;Disoriented to time;Disoriented to situation;Oriented to place Attention: Focused Focused Attention: Impaired Focused Attention Impairment: Verbal basic;Functional basic Memory: Impaired Memory Impairment: Storage deficit;Retrieval deficit Awareness: Impaired Awareness Impairment: Intellectual impairment Safety/Judgment: Impaired    Comprehension  Auditory Comprehension Yes/No Questions: Within Functional Limits Commands: Impaired One Step Basic Commands: 25-49% accurate Interfering Components: Attention;Processing speed;Working memory Reading Comprehension Reading Status: Not tested    Expression Expression Primary Mode of Expression: Verbal Verbal Expression Overall Verbal Expression: Appears within functional limits for tasks assessed Written Expression Written Expression: Not tested   Oral / Motor Oral Motor/Sensory Function Overall Oral Motor/Sensory Function: Impaired Motor Speech Overall Motor Speech: Impaired Respiration: Impaired Level of Impairment: Phrase Phonation: Wet Articulation: Impaired Level of Impairment: Phrase Intelligibility: Intelligibility reduced Word: 50-74% accurate Phrase: 50-74% accurate Sentence: 50-74% accurate Conversation: 50-74% accurate Motor Speech Errors: Not applicable   Steven Garcia, [place] CCC/SLP Pager [phone]      Blenda MountsCouture, Diesel Lina Laurice 11/18/2013, 4:00 PM

## 2013-11-18 NOTE — Plan of Care (Signed)
Patient seen and evaluated.  Met pt's daughter, who is the healthcare decision maker for the pt, at bedside and explained to her about the diagnosis, current plan and prognosis. She understood the severity of the ICH and the high mortality rate and disability rate. She works in [place] and understood the current situation. She request DNR and DNI and I agree with her decision. At the meantime, no aggressive or escalation of current treatment. If pt condition gets worse, she will consider withdraw of care and comfort care at that time.   Rosalin Hawking, MD PhD 11/18/2013 9:18 AM

## 2013-11-18 NOTE — Progress Notes (Addendum)
PULMONARY / CRITICAL CARE MEDICINE   Name: Steven Garcia MRN: [id number] DOB: 12/08/1928    ADMISSION DATE:  11/17/2013 CONSULTATION DATE:  11/17/2013  REFERRING MD :  EDP  CHIEF COMPLAINT:  ICH  INITIAL PRESENTATION: 80 yo brought to ED after being found unresponsive by family members, last seen normal 24 hours ago.  In ED found to have large right basal ganglia hemorrhage.  STUDIES:  8/3 CT Head >>> 35 x 62mm hematoma in R basal ganglia.  SIGNIFICANT EVENTS: 8/3 Admit, mannitol  INTERVAL HISTORY:  Received mannitol last night  VITAL SIGNS: Temp:  [96.9 F (36.1 C)-99.6 F (37.6 C)] 99.6 F (37.6 C) (08/04 0415) Pulse Rate:  [81-109] 81 (08/04 0700) Resp:  [14-28] 16 (08/04 0700) BP: (108-181)/(47-108) 114/62 mmHg (08/04 0700) SpO2:  [88 %-100 %] 100 % (08/04 0700) Weight:  [164 lb 0.4 oz (74.4 kg)-173 lb 4.5 oz (78.6 kg)] 173 lb 4.5 oz (78.6 kg) (08/04 0600) INTAKE / OUTPUT: Intake/Output     08/03 0701 - 08/04 0700 08/04 0701 - 08/05 0700   I.V. (mL/kg) 1900 (24.2)    IV Piggyback 203    Total Intake(mL/kg) 2103 (26.8)    Urine (mL/kg/hr) 2900    Total Output 2900     Net -797           PHYSICAL EXAMINATION: General: ill appearing Neuro: mumbles name on command, facial drop, Lt side weak HEENT: weak cough Cardiovascular: regular  Lungs: scattered rhonchi Abdomen: soft, non tender Musculoskeletal: no edema Skin: no rashes  LABS:  CBC  Recent Labs Lab 11/17/13 1713 11/17/13 1825 11/18/13 0218  WBC 13.4*  --  10.5  HGB 14.4 16.0 13.5  HCT 40.6 47.0 38.2*  PLT 183  --  175   Coag's  Recent Labs Lab 11/17/13 1713  APTT 29  INR 1.02   BMET  Recent Labs Lab 11/17/13 1713 11/17/13 1825 11/18/13 0218  NA 128* 128* 132*  K 5.0 4.7 4.4  CL 91* 94* 96  CO2 24  --  24  BUN 9 8 9   CREATININE 0.76 0.90 0.93  GLUCOSE 131* 139* 104*   Electrolytes  Recent Labs Lab 11/17/13 1713 11/18/13 0218  CALCIUM 10.9* 10.6*  MG  --  1.5  PHOS  --   1.9*   Sepsis Markers  Recent Labs Lab 11/17/13 1820  LATICACIDVEN 2.52*   Liver Enzymes  Recent Labs Lab 11/17/13 1713  AST 72*  ALT 29  ALKPHOS 48  BILITOT 1.0  ALBUMIN 3.8   Glucose  Recent Labs Lab 11/18/13 0002  GLUCAP 101*    IMAGING:  Ct Head Wo Contrast  11/17/2013   CLINICAL DATA:  Altered mental status.  Unresponsive  EXAM: CT HEAD WITHOUT CONTRAST  CT CERVICAL SPINE WITHOUT CONTRAST  TECHNIQUE: Multidetector CT imaging of the head and cervical spine was performed following the standard protocol without intravenous contrast. Multiplanar CT image reconstructions of the cervical spine were also generated.  COMPARISON:  CT head 11/16/2009  FINDINGS: CT HEAD FINDINGS  Large area of acute hemorrhage in the right basal ganglia. Epicenter in the lateral basal ganglia/ external capsule. The hematoma measures 35 x 62 mm. There is some low-density serum or edema anterior to the hematoma. There is mass-effect or with 5 mm midline shift to the left. No subdural or subarachnoid hemorrhage is identified.  Generalized atrophy. Ventricle size is normal. Right lateral ventricle is compressed by the hematoma.  CT CERVICAL SPINE FINDINGS  Normal cervical alignment.  Diffuse cervical disc degeneration and spondylosis of a mild-to-moderate degree. Mild to moderate facet degeneration is present throughout the cervical spine.  Negative for fracture.  IMPRESSION: 35 x 62 mm hematoma right basal ganglia. This most likely is due to hypertension. There is local mass-effect.  Cervical degenerative change.  Negative for acute fracture.  Critical Value/emergent results were called by telephone at the time of interpretation on 11/17/2013 at 6:45 pm to Dr. Doug SouSAM JACUBOWITZ , who verbally acknowledged these results.   Electronically Signed   By: Marlan Palauharles  Clark M.D.   On: 11/17/2013 18:45   Ct Cervical Spine Wo Contrast  11/17/2013   CLINICAL DATA:  Altered mental status.  Unresponsive  EXAM: CT HEAD WITHOUT  CONTRAST  CT CERVICAL SPINE WITHOUT CONTRAST  TECHNIQUE: Multidetector CT imaging of the head and cervical spine was performed following the standard protocol without intravenous contrast. Multiplanar CT image reconstructions of the cervical spine were also generated.  COMPARISON:  CT head 11/16/2009  FINDINGS: CT HEAD FINDINGS  Large area of acute hemorrhage in the right basal ganglia. Epicenter in the lateral basal ganglia/ external capsule. The hematoma measures 35 x 62 mm. There is some low-density serum or edema anterior to the hematoma. There is mass-effect or with 5 mm midline shift to the left. No subdural or subarachnoid hemorrhage is identified.  Generalized atrophy. Ventricle size is normal. Right lateral ventricle is compressed by the hematoma.  CT CERVICAL SPINE FINDINGS  Normal cervical alignment. Diffuse cervical disc degeneration and spondylosis of a mild-to-moderate degree. Mild to moderate facet degeneration is present throughout the cervical spine.  Negative for fracture.  IMPRESSION: 35 x 62 mm hematoma right basal ganglia. This most likely is due to hypertension. There is local mass-effect.  Cervical degenerative change.  Negative for acute fracture.  Critical Value/emergent results were called by telephone at the time of interpretation on 11/17/2013 at 6:45 pm to Dr. Doug SouSAM JACUBOWITZ , who verbally acknowledged these results.   Electronically Signed   By: Marlan Palauharles  Clark M.D.   On: 11/17/2013 18:45   ASSESSMENT / PLAN: NEUROLOGIC A:   Large right basal ganglia hemarrhage. Bipolar disorder. P:   Mannitol per neurology Continue outpatient Depakote F/u neuro imaging per neurology  PULMONARY A: Atelectasis. P: Oxygen to keep SpO2 > 92% F/u CXR intermittently Bronchial hygiene  CARDIOVASCULAR A:  Hx of HTN, hyperlipidemia. P:  Goal SBP < 160 per Neurology Hold outpatient Lisinopril, Lovastatin Labetalol PRN  RENAL A:  Hyponatremia. P:   F/u BMET Continue NS IV  fluid  GASTROINTESTINAL A:  Hx of GERD. Nutrition. P:   NPO Will need speech evaluation prior to advancing diet Protonix for SUP  HEMATOLOGIC A:  Leukocytosis. P:  SCD's F/u CBC   INFECTIOUS A:   No evidence for infection. P:   Monitor clinically  ENDOCRINE A:   DM type II. P:   Hold outpatient oral DM meds SSI  Updated family at bedside and discussed with neurology.  Goals of care >> agree with plan to d/w family about appropriate goals of care >> defer this discussion to neurology.  Coralyn HellingVineet Astin Rape, MD [facility]/Critical [facility] 11/18/2013, 8:11 AM Pager:  [phone] After 3pm call: [phone]

## 2013-11-19 ENCOUNTER — Inpatient Hospital Stay (HOSPITAL_COMMUNITY): Payer: Medicare Other

## 2013-11-19 DIAGNOSIS — E119 Type 2 diabetes mellitus without complications: Secondary | ICD-10-CM

## 2013-11-19 LAB — BASIC METABOLIC PANEL
ANION GAP: 13 (ref 5–15)
BUN: 14 mg/dL (ref 6–23)
CO2: 26 mEq/L (ref 19–32)
Calcium: 10.2 mg/dL (ref 8.4–10.5)
Chloride: 98 mEq/L (ref 96–112)
Creatinine, Ser: 1.01 mg/dL (ref 0.50–1.35)
GFR, EST AFRICAN AMERICAN: 77 mL/min — AB (ref >90)
GFR, EST NON AFRICAN AMERICAN: 67 mL/min — AB (ref >90)
Glucose, Bld: 123 mg/dL — ABNORMAL HIGH (ref 70–99)
POTASSIUM: 4.4 meq/L (ref 3.7–5.3)
SODIUM: 137 meq/L (ref 137–147)

## 2013-11-19 LAB — GLUCOSE, CAPILLARY
GLUCOSE-CAPILLARY: 110 mg/dL — AB (ref 70–99)
Glucose-Capillary: 118 mg/dL — ABNORMAL HIGH (ref 70–99)
Glucose-Capillary: 120 mg/dL — ABNORMAL HIGH (ref 70–99)
Glucose-Capillary: 121 mg/dL — ABNORMAL HIGH (ref 70–99)
Glucose-Capillary: 91 mg/dL (ref 70–99)
Glucose-Capillary: 99 mg/dL (ref 70–99)

## 2013-11-19 LAB — CBC
HCT: 37.9 % — ABNORMAL LOW (ref 39.0–52.0)
Hemoglobin: 12.8 g/dL — ABNORMAL LOW (ref 13.0–17.0)
MCH: 30.6 pg (ref 26.0–34.0)
MCHC: 33.8 g/dL (ref 30.0–36.0)
MCV: 90.7 fL (ref 78.0–100.0)
PLATELETS: 149 10*3/uL — AB (ref 150–400)
RBC: 4.18 MIL/uL — ABNORMAL LOW (ref 4.22–5.81)
RDW: 13.5 % (ref 11.5–15.5)
WBC: 10 10*3/uL (ref 4.0–10.5)

## 2013-11-19 LAB — OSMOLALITY: OSMOLALITY: 285 mosm/kg (ref 275–300)

## 2013-11-19 MED ORDER — AMLODIPINE BESYLATE 10 MG PO TABS
10.0000 mg | ORAL_TABLET | Freq: Every day | ORAL | Status: DC
  Administered 2013-11-19 – 2013-11-20 (×2): 10 mg via NASOGASTRIC
  Filled 2013-11-19 (×2): qty 1

## 2013-11-19 MED ORDER — STERILE WATER FOR INJECTION IV SOLN
INTRAVENOUS | Status: DC
  Administered 2013-11-19 – 2013-11-20 (×2): via INTRAVENOUS
  Filled 2013-11-19 (×4): qty 914.5

## 2013-11-19 MED ORDER — JEVITY 1.2 CAL PO LIQD
1000.0000 mL | ORAL | Status: DC
  Filled 2013-11-19 (×4): qty 1000

## 2013-11-19 MED ORDER — HEPARIN SODIUM (PORCINE) 5000 UNIT/ML IJ SOLN
5000.0000 [IU] | Freq: Two times a day (BID) | INTRAMUSCULAR | Status: DC
  Administered 2013-11-19 – 2013-11-20 (×3): 5000 [IU] via SUBCUTANEOUS
  Filled 2013-11-19 (×5): qty 1

## 2013-11-19 MED ORDER — INSULIN ASPART 100 UNIT/ML ~~LOC~~ SOLN
0.0000 [IU] | Freq: Four times a day (QID) | SUBCUTANEOUS | Status: DC
  Administered 2013-11-20: 2 [IU] via SUBCUTANEOUS

## 2013-11-19 NOTE — Progress Notes (Signed)
INITIAL NUTRITION ASSESSMENT  DOCUMENTATION CODES Per approved criteria  -Not Applicable   INTERVENTION: Initiate Jevity 1.2 @ 25 ml/hr via NG tube and increase by 10 ml every 4 hours to goal rate of 65 ml/hr.   30 ml Prostat daily.    Tube feeding regimen provides 1972 kcal, 101 grams of protein, and 1265 ml of H2O.   NUTRITION DIAGNOSIS: Inadequate oral intake related to inability to eat as evidenced by NPO status  Goal: Pt to meet >/= 90% of their estimated nutrition needs   Monitor:  TF initiation/tolerance, weight trend, labs  Reason for Assessment: Consult received to initiate and manage enteral nutrition support.  80 y.o. male  Admitting Dx: Right ICH, large bleed  ASSESSMENT: Pt remains lethargic and with a poor prognosis per MD. Asked to start NG tube feedings as pt unable to pass swallow, SLP following.  NG tube placed. Tip at the duodenal bulb.  Nutrition-focused physical exam did not reveal any fat or muscle depletion. Pt did not wake during the exam or for questions. Per RN no family has been around.  Cbg's: 104-139  Height: Ht Readings from Last 1 Encounters:  11/17/13 6' (1.829 m)    Weight: Wt Readings from Last 1 Encounters:  11/19/13 175 lb 0.7 oz (79.4 kg)    Ideal Body Weight: 80.9 kg   % Ideal Body Weight: 98%  Wt Readings from Last 10 Encounters:  11/19/13 175 lb 0.7 oz (79.4 kg)  07/23/13 164 lb (74.39 kg)    Usual Body Weight: 164 lb   % Usual Body Weight: >100%  BMI:  Body mass index is 23.74 kg/(m^2).  Estimated Nutritional Needs: Kcal: 1800-2000 Protein: 90-110 grams Fluid: > 1.8 L/day  Skin: intact  Diet Order: NPO  EDUCATION NEEDS: -No education needs identified at this time   Intake/Output Summary (Last 24 hours) at 11/19/13 1540 Last data filed at 11/19/13 1400  Gross per 24 hour  Intake   2319 ml  Output   2780 ml  Net   -461 ml    Last BM: none documented   Labs:   Recent Labs Lab 11/17/13 1713  11/17/13 1825 11/18/13 0218 11/19/13 0245  NA 128* 128* 132* 137  K 5.0 4.7 4.4 4.4  CL 91* 94* 96 98  CO2 24  --  24 26  BUN 9 8 9 14   CREATININE 0.76 0.90 0.93 1.01  CALCIUM 10.9*  --  10.6* 10.2  MG  --   --  1.5  --   PHOS  --   --  1.9*  --   GLUCOSE 131* 139* 104* 123*    CBG (last 3)   Recent Labs  11/19/13 0332 11/19/13 0726 11/19/13 1234  GLUCAP 110* 120* 99    Scheduled Meds: . amLODipine  10 mg Per NG tube Daily  . antiseptic oral rinse  7 mL Mouth Rinse 6 times per day  . heparin subcutaneous  5,000 Units Subcutaneous Q12H  . insulin aspart  0-15 Units Subcutaneous 6 times per day  . mannitol  0.25 g/kg Intravenous TID  . pantoprazole (PROTONIX) IV  40 mg Intravenous QHS  . senna-docusate  1 tablet Oral BID  . valproate sodium  500 mg Intravenous 3 times per day    Continuous Infusions: . Sodium chloride (hypertonic) 2% solution      Past Medical History  Diagnosis Date  . Diabetes     with proteinuria  . Hypercholesteremia   . Arthritis   .  IBS (irritable bowel syndrome)   . GERD (gastroesophageal reflux disease)   . Bipolar disorder     Past Surgical History  Procedure Laterality Date  . Cholecystectomy    . Hemorroidectomy    . Cataract extraction, bilateral      Kendell BaneHeather Pitts RD, LDN, CNSC [phone] Pager [phone] After Hours Pager

## 2013-11-19 NOTE — Progress Notes (Signed)
PULMONARY / CRITICAL CARE MEDICINE   Name: Steven Garcia MRN: [id number] DOB: [birth year]    ADMISSION DATE:  11/17/2013 CONSULTATION DATE:  11/17/2013  REFERRING MD :  EDP  CHIEF COMPLAINT:  ICH  INITIAL PRESENTATION: 80 yo brought to ED after being found unresponsive by family members, last seen normal 24 hours ago.  In ED found to have large right basal ganglia hemorrhage.  STUDIES:  8/3 CT Head >>> 35 x 62mm hematoma in R basal ganglia.  SIGNIFICANT EVENTS: 8/3 Admit, mannitol 8/4 Family d/w neurology >> DNR  INTERVAL HISTORY:  Having long periods of apnea with oxygen desaturation.  VITAL SIGNS: Temp:  [98.3 F (36.8 C)-100.5 F (38.1 C)] 98.6 F (37 C) (08/05 0800) Pulse Rate:  [75-107] 78 (08/05 0700) Resp:  [11-32] 24 (08/05 0700) BP: (141-182)/(61-97) 155/70 mmHg (08/05 0700) SpO2:  [90 %-100 %] 91 % (08/05 0700) Weight:  [175 lb 0.7 oz (79.4 kg)] 175 lb 0.7 oz (79.4 kg) (08/05 0600) INTAKE / OUTPUT: Intake/Output     08/04 0701 - 08/05 0700 08/05 0701 - 08/06 0700   I.V. (mL/kg) 1725 (21.7) 75 (0.9)   Other 720    IV Piggyback 389 55   Total Intake(mL/kg) 2834 (35.7) 130 (1.6)   Urine (mL/kg/hr) 1125 (0.6) 55 (0.4)   Total Output 1125 55   Net +1709 +75         PHYSICAL EXAMINATION: General: ill appearing Neuro: mumbles sounds HEENT: weak cough Cardiovascular: regular  Lungs: scattered rhonchi Abdomen: soft, non tender Musculoskeletal: no edema Skin: no rashes  LABS:  CBC  Recent Labs Lab 11/17/13 1713 11/17/13 1825 11/18/13 0218 11/19/13 0245  WBC 13.4*  --  10.5 10.0  HGB 14.4 16.0 13.5 12.8*  HCT 40.6 47.0 38.2* 37.9*  PLT 183  --  175 149*   Coag's  Recent Labs Lab 11/17/13 1713  APTT 29  INR 1.02   BMET  Recent Labs Lab 11/17/13 1713 11/17/13 1825 11/18/13 0218 11/19/13 0245  NA 128* 128* 132* 137  K 5.0 4.7 4.4 4.4  CL 91* 94* 96 98  CO2 24  --  24 26  BUN 9 8 9 14   CREATININE 0.76 0.90 0.93 1.01  GLUCOSE 131*  139* 104* 123*   Electrolytes  Recent Labs Lab 11/17/13 1713 11/18/13 0218 11/19/13 0245  CALCIUM 10.9* 10.6* 10.2  MG  --  1.5  --   PHOS  --  1.9*  --    Sepsis Markers  Recent Labs Lab 11/17/13 1820  LATICACIDVEN 2.52*   Liver Enzymes  Recent Labs Lab 11/17/13 1713  AST 72*  ALT 29  ALKPHOS 48  BILITOT 1.0  ALBUMIN 3.8   Glucose  Recent Labs Lab 11/18/13 1125 11/18/13 1602 11/18/13 2014 11/18/13 2337 11/19/13 0332 11/19/13 0726  GLUCAP 101* 137* 120* 118* 110* 120*    IMAGING:  Ct Head Wo Contrast  11/18/2013   CLINICAL DATA:  Followup intracranial hemorrhage.  LEFT hemiparesis.  EXAM: CT HEAD WITHOUT CONTRAST  TECHNIQUE: Contiguous axial images were obtained from the base of the skull through the vertex without contrast.  COMPARISON:  11/17/2013.  FINDINGS: Large intracerebral hematoma RIGHT basal ganglia persists without significant increase in size. Cross-sectional measurements as seen on image 14 are 35 x 71 mm. There is moderate surrounding edema. There is increasing RIGHT-to-LEFT shift now measuring 5 mm 6 mm. Generalized atrophy. No subarachnoid blood. No uncal herniation.  IMPRESSION: No increase in size in the intracerebral hematoma, likely  hypertensive in origin. Mild increase in surrounding edema. Minimal increased RIGHT-to-LEFT shift now measuring 6 mm.  Findings discussed with ordering provider.   Electronically Signed   By: Davonna Belling M.D.   On: 11/18/2013 09:46   Ct Head Wo Contrast  11/17/2013   CLINICAL DATA:  Altered mental status.  Unresponsive  EXAM: CT HEAD WITHOUT CONTRAST  CT CERVICAL SPINE WITHOUT CONTRAST  TECHNIQUE: Multidetector CT imaging of the head and cervical spine was performed following the standard protocol without intravenous contrast. Multiplanar CT image reconstructions of the cervical spine were also generated.  COMPARISON:  CT head 11/16/2009  FINDINGS: CT HEAD FINDINGS  Large area of acute hemorrhage in the right basal  ganglia. Epicenter in the lateral basal ganglia/ external capsule. The hematoma measures 35 x 62 mm. There is some low-density serum or edema anterior to the hematoma. There is mass-effect or with 5 mm midline shift to the left. No subdural or subarachnoid hemorrhage is identified.  Generalized atrophy. Ventricle size is normal. Right lateral ventricle is compressed by the hematoma.  CT CERVICAL SPINE FINDINGS  Normal cervical alignment. Diffuse cervical disc degeneration and spondylosis of a mild-to-moderate degree. Mild to moderate facet degeneration is present throughout the cervical spine.  Negative for fracture.  IMPRESSION: 35 x 62 mm hematoma right basal ganglia. This most likely is due to hypertension. There is local mass-effect.  Cervical degenerative change.  Negative for acute fracture.  Critical Value/emergent results were called by telephone at the time of interpretation on 11/17/2013 at 6:45 pm to Dr. Doug Sou , who verbally acknowledged these results.   Electronically Signed   By: Marlan Palau M.D.   On: 11/17/2013 18:45   Ct Cervical Spine Wo Contrast  11/17/2013   CLINICAL DATA:  Altered mental status.  Unresponsive  EXAM: CT HEAD WITHOUT CONTRAST  CT CERVICAL SPINE WITHOUT CONTRAST  TECHNIQUE: Multidetector CT imaging of the head and cervical spine was performed following the standard protocol without intravenous contrast. Multiplanar CT image reconstructions of the cervical spine were also generated.  COMPARISON:  CT head 11/16/2009  FINDINGS: CT HEAD FINDINGS  Large area of acute hemorrhage in the right basal ganglia. Epicenter in the lateral basal ganglia/ external capsule. The hematoma measures 35 x 62 mm. There is some low-density serum or edema anterior to the hematoma. There is mass-effect or with 5 mm midline shift to the left. No subdural or subarachnoid hemorrhage is identified.  Generalized atrophy. Ventricle size is normal. Right lateral ventricle is compressed by the hematoma.   CT CERVICAL SPINE FINDINGS  Normal cervical alignment. Diffuse cervical disc degeneration and spondylosis of a mild-to-moderate degree. Mild to moderate facet degeneration is present throughout the cervical spine.  Negative for fracture.  IMPRESSION: 35 x 62 mm hematoma right basal ganglia. This most likely is due to hypertension. There is local mass-effect.  Cervical degenerative change.  Negative for acute fracture.  Critical Value/emergent results were called by telephone at the time of interpretation on 11/17/2013 at 6:45 pm to Dr. Doug Sou , who verbally acknowledged these results.   Electronically Signed   By: Marlan Palau M.D.   On: 11/17/2013 18:45   ASSESSMENT / PLAN: NEUROLOGIC A:   Large right basal ganglia hemarrhage. Bipolar disorder. P:   Per neurology  PULMONARY A: Atelectasis. Central apnea. P: Oxygen to keep SpO2 > 92% Bronchial hygiene DNR/DNI  CARDIOVASCULAR A:  Hx of HTN, hyperlipidemia. P:  Goal SBP < 160 per Neurology Hold outpatient Lisinopril, Lovastatin  Labetalol PRN  RENAL A:  Hyponatremia >> improved. P:   F/u BMET Continue NS IV fluid  GASTROINTESTINAL A:  Hx of GERD. Nutrition. Dysphagia >> failed swallow evaluation 8/04. P:   NPO Will need panda tube for tube feeds if family wishes to continue aggressive care Protonix for SUP  HEMATOLOGIC A:  Leukocytosis >> improved. P:  SCD's F/u CBC intermittently  INFECTIOUS A:   No evidence for infection. P:   Monitor clinically  ENDOCRINE A:   DM type II. P:   Hold outpatient oral DM meds SSI  Goals of care >> DNR/DNI.  If he continues to get worse, then likely transition to comfort care.  Will defer further management to primary team.  PCCM will sign off.  Please call if additional help is needed.  Coralyn HellingVineet Illianna Paschal, MD [facility] Pulmonary/Critical Care 11/19/2013, 8:50 AM Pager:  [phone] After 3pm call: [phone]

## 2013-11-19 NOTE — Progress Notes (Signed)
OT Cancellation Note  Patient Details Name: Roxana Hiresugene E Dettman MRN: [id number] DOB: 09/05/1928   Cancelled Treatment:    Reason Eval/Treat Not Completed: Medical issues which prohibited therapy;Patient not medically ready Pt still on bedrest per MD pt will not be ready for evaluation today.  Will check back tomorrow for updated activity orders.  Masiyah Engen OTR/L 11/19/2013, 9:21 AM

## 2013-11-19 NOTE — Progress Notes (Signed)
Speech Language Pathology Treatment: Cognitive-Linquistic;Dysphagia  Patient Details Name: Steven Garcia MRN: [id number] DOB: 10/31/1928 Today's Date: 11/19/2013 Time: [id number] SLP Time Calculation (min): 20 min  Assessment / Plan / Recommendation Clinical Impression  Patient with decreased arousal despite Max multimodal cues for eye opening and sustained attention to self care tasks such as oral care.  Hand-over-hand assist for oral care resulted in brief attention to task 1-2 seconds with no attempts to open eyes. Patient verbally requesting something to drink "Pepsi" but teaspoon trials of ice chips revealed an absent labial seal with no awareness of bolus and no swallow response despite Max cues.  Patient demonstrated wet vocal quality but no cough response was observed.  Impaired cognition appears to be most limiting factor at this time and patient may benefit from co-treatment with PT/OT to increase arousal when medically stable.  Recommend to continue with alternative means for all PO at this time.    HPI HPI: 80 yo brought to ED after being found unresponsive by family member.  In ED found to have large right basal ganglia hemorrhage.  8/3 CT Head >>> 35 x 62mm hematoma in R basal ganglia.   Pertinent Vitals None  SLP Plan  Continue with current plan of care    Recommendations Diet recommendations: NPO Medication Administration: Via alternative means              Oral Care Recommendations: Oral care Q4 per protocol Follow up Recommendations: Inpatient Rehab;24 hour supervision/assistance Plan: Continue with current plan of care    GO     Charlane FerrettiMelissa Aleira Garcia, M.A., CCC-SLP [phone]  Steven Garcia 11/19/2013, 11:33 AM

## 2013-11-19 NOTE — Progress Notes (Addendum)
Pt's daughter Elita Quickam has requested to not start tube feeding with plans to progress to comfort care. Dr. Roda ShuttersXu spoke with daughter and is aware of her wishes. Will continue to monitor.

## 2013-11-19 NOTE — Progress Notes (Signed)
PT Cancellation Note  Patient Details Name: Steven Garcia MRN: [id number] DOB: 10/09/1928   Cancelled Treatment:    Reason Eval/Treat Not Completed: Patient not medically ready (spoke with MD Dr. Roda ShuttersXu, hold PT at this time)   [name], [name] 11/19/2013, 9:21 AM [name], PT DPT  [phone]

## 2013-11-19 NOTE — Progress Notes (Addendum)
Stroke Team Progress Note   Admit Date: 11/17/2013  LOS: 2 days   HISTORY Steven Garcia is an 80 y.o. male, right handed, with a past medical history significant for DM, hypercholesterolemia, bipolar disorder, IBS, GERD, brought in by EMS after being by family unresponsive and unable to move the left side on 11/17/13. SBP 180 upon arrival to the ED. CT brain revealed a 35 x 62 mm hematoma right basal ganglia with mass effect and 5 mm midline shift. No recent trauma. No reported use of anticoagulants.  INR 1.02, PTT 29, platelets 183  Date last known well: 11/16/13  Time last known well: 1730  tPA Given: no, ICH He was admitted to the neuro ICU for further evaluation and treatment.  SUBJECTIVE No family members was at the bedside during rounds. Pt still lethargic not able to open eyes but follow some simple commands on the right hand. Not able to answer questions right, still very slurry speech and mumbling. Overnight BP < 160. His condition overall stable but getting a little worse than yesterday.  OBJECTIVE Most recent Vital Signs: Filed Vitals:   11/19/13 1300 11/19/13 1315 11/19/13 1330 11/19/13 1400  BP: 169/70 150/64 151/63 169/61  Pulse: 97 87 87 85  Temp:      TempSrc:      Resp: 11 22 24 10   Height:      Weight:      SpO2: 93% 92% 93% 96%   CBG (last 3)   Recent Labs  11/19/13 0332 11/19/13 0726 11/19/13 1234  GLUCAP 110* 120* 99     IV Fluid Intake:   . sodium chloride 75 mL/hr at 11/19/13 0416    Medications: Scheduled:  . amLODipine  10 mg Per NG tube Daily  . antiseptic oral rinse  7 mL Mouth Rinse 6 times per day  . heparin subcutaneous  5,000 Units Subcutaneous Q12H  . insulin aspart  0-15 Units Subcutaneous 6 times per day  . mannitol  0.25 g/kg Intravenous TID  . pantoprazole (PROTONIX) IV  40 mg Intravenous QHS  . senna-docusate  1 tablet Oral BID  . valproate sodium  500 mg Intravenous 3 times per day   Continuous Infusions:  . sodium chloride 75  mL/hr at 11/19/13 0416   PRN: @MEDSPRNSH @  Diet:  NPO, will start tube feeding Activity:  Bedrest DVT Prophylaxis:  SCDs, put on heparin subq today  CLINICALLY SIGNIFICANT STUDIES Basic Metabolic Panel:   Recent Labs Lab 11/17/13 1825 11/18/13 0218 11/19/13 0245  NA 128* 132* 137  K 4.7 4.4 4.4  CL 94* 96 98  CO2  --  24 26  GLUCOSE 139* 104* 123*  BUN 8 9 14   CREATININE 0.90 0.93 1.01  CALCIUM  --  10.6* 10.2  MG  --  1.5  --   PHOS  --  1.9*  --    Liver Function Tests:   Recent Labs Lab 11/17/13 1713  AST 72*  ALT 29  ALKPHOS 48  BILITOT 1.0  PROT 8.1  ALBUMIN 3.8   CBC:  Recent Labs Lab 11/17/13 1713  11/18/13 0218 11/19/13 0245  WBC 13.4*  --  10.5 10.0  NEUTROABS 10.8*  --   --   --   HGB 14.4  < > 13.5 12.8*  HCT 40.6  < > 38.2* 37.9*  MCV 87.1  --  89.5 90.7  PLT 183  --  175 149*  < > = values in this interval not displayed. Coagulation:  Recent Labs Lab 11/17/13 1713  LABPROT 13.4  INR 1.02   Cardiac Enzymes:   Recent Labs Lab 11/17/13 1713  CKTOTAL 2587*   Urinalysis:   Recent Labs Lab 11/17/13 1909  COLORURINE YELLOW  LABSPEC 1.012  PHURINE 6.5  GLUCOSEU 250*  HGBUR SMALL*  BILIRUBINUR NEGATIVE  KETONESUR 15*  PROTEINUR 100*  UROBILINOGEN 1.0  NITRITE NEGATIVE  LEUKOCYTESUR NEGATIVE   Lipid Panel No results found for this basename: chol,  trig,  hdl,  cholhdl,  vldl,  ldlcalc   HgbA1C .resu Lab Results  Component Value Date   HGBA1C  Value: 7.0 (NOTE)                                                                       According to the ADA Clinical Practice Recommendations for 2011, when HbA1c is used as a screening test:   >=6.5%   Diagnostic of Diabetes Mellitus           (if abnormal result  is confirmed)  5.7-6.4%   Increased risk of developing Diabetes Mellitus  References:Diagnosis and Classification of Diabetes Mellitus,Diabetes Care,2011,34(Suppl 1):S62-S69 and Standards of Medical Care in         Diabetes  - 2011,Diabetes Care,2011,34  (Suppl 1):S11-S61.* 11/17/2009    Urine Drug Screen:     Component Value Date/Time   LABOPIA NONE DETECTED 11/17/2013 1909   COCAINSCRNUR NONE DETECTED 11/17/2013 1909   LABBENZ NONE DETECTED 11/17/2013 1909   AMPHETMU NONE DETECTED 11/17/2013 1909   THCU NONE DETECTED 11/17/2013 1909   LABBARB NONE DETECTED 11/17/2013 1909    Alcohol Level:   Recent Labs Lab 11/17/13 1713  ETH <11   I have reviewed  Imaging studies below personally and independently.  Ct Head Wo Contrast  11/18/2013  IMPRESSION: No increase in size in the intracerebral hematoma, likely hypertensive in origin. Mild increase in surrounding edema. Minimal increased RIGHT-to-LEFT shift now measuring 6 mm.  Findings discussed with ordering provider.   Ct Head Wo Contrast  11/17/2013   IMPRESSION: 35 x 62 mm hematoma right basal ganglia. This most likely is due to hypertension. There is local mass-effect.  Cervical degenerative change.  Negative for acute fracture.    Ct Cervical Spine Wo Contrast  11/17/2013   IMPRESSION: 35 x 62 mm hematoma right basal ganglia. This most likely is due to hypertension. There is local mass-effect.  Cervical degenerative change.  Negative for acute fracture.   EKG  Atrial flutter. For complete results please see formal report.   PT/OT Recommendations cancelled at this time.  Physical Exam Temp:  [98.3 F (36.8 C)-100.5 F (38.1 C)] 99.9 F (37.7 C) (08/05 1200) Pulse Rate:  [75-101] 85 (08/05 1400) Resp:  [10-32] 10 (08/05 1400) BP: (150-182)/(61-97) 169/61 mmHg (08/05 1400) SpO2:  [90 %-100 %] 96 % (08/05 1400) Weight:  [175 lb 0.7 oz (79.4 kg)] 175 lb 0.7 oz (79.4 kg) (08/05 0600)  General - Well nourished, well developed, lethargic and eyes closed.  Ophthalmologic - not able to see through.  Cardiovascular - Regular rate and rhythm with no murmur.  Lung - bilateral coarse rhonchi  Neuro - lethargic and eyes not open, severe dysarthria, said he was "53"  and not known his last  name. Pupil 2mm bilaterally, reactive to light, right eye gaze preference, left neglect, left facial droop, left UE no movement on pain but left LE trace withdraw with pain stimulation, right UE and LE spontaneously movement with tremor on the R hand, follow simple commands on right UE and LE with perseveration. Left babinski positive, reflex 1+ throughout.  ASSESSMENT Mr. Steven Garcia is a 80 y.o. male with PMH of HTN and DM found down at home. Found to have left hemiplegia. CT showed right BG big bleeding, around 64 cc with midline shift. His ICH score is 3 carrying 72% mortality within 30 days. Poor prognosis overall. His condition stable but did worsened a little on mental status. Expect worsening clinically due to cerebral edema in the next couple of days with risk of herniation.    HTN  A1C 7.0  Age   TREATMENT/Garcia  Right BG bleeding  Large bleeding with 64 cc  ICH score 3 - 72% mortality within a month  BP goal < 160  Repeat CT no further bleeding  GI and DVT prophylaxis - start heparin subq for DVT prophylaxis, on protonix  start 2% saline which does not require central line  Na Q8h check with goal 145-155.  I had long discussion with daughter, his health care surrogate, regarding His stroke, risk factors, prognosis, treatment Garcia and answered all questions. She understood the severity and critical condition, has made decision for DNR and DNI   Will continue to monitor mental status. May consider withdraw care if pt deteriorates as per daughter  Speech recommend NPO, will put NG tube and start tube feeding. Discussed with daughter and she agreed.  HTN - BP intermittently >160 - start po meds amlodipine once NG tube placed. - keep monitoring - cardene drip if needed  Apnea - pt was noted to have intermittent apnea with desaturation - will not attempt CPAP due to risk of aspiration - no intubation as per daughter given DNI - continue  monitoring  DM - A1C 7.0 - Glucose 104-139 for the last 24 hours - keep monitoring   Elevated CK - check CK in am - expect down trending  This patient is critically ill due to ICH and at significant risk of neurological worsening and respiratory failure as well as cardiac arrest, death form recurrent hemorrhage, cerebral edema and brain herniation. This patient's care requires constant monitoring of vital signs, hemodynamics, respiratory and cardiac monitoring, review of multiple databases, neurological assessment, discussion with family, other specialists and medical decision making of high complexity. Had long discussion with daughter regarding his diagnosis, treatment options and prognosis. I spent 35 minutes of neurocritical care time in the care of this patient with more than 50% in counseling and coordinating care.   SIGNED Steven Plan, MD PhD 11/19/2013 2:43 PM      To contact Stroke Continuity provider, please refer to [email or web address]. After hours, contact General Neurology

## 2013-11-19 NOTE — Clinical Documentation Improvement (Signed)
Abnormal diagnostic findings (MRI scans, CT scans, etc.) are not coded and reported unless the physician indicates their clinical significance. If possible, please help by clarifying the diagnostic and/or clinical significance of the abnormal diagnostic study for this patient.   Cerebral Edema   Cytotoxic Edema   Subfalcine Herniation   Uncal/Transtentorial Herniation   Vasogenic Edema   Other brain herniation (please specify)    Other cause (please specify)   Unable to determine   Unknown   Risk Factors: CT brain revealed a 35 x 62 mm hematoma right basal ganglia with mass effect and 5mm midline shift per 8/04 progress notes.  Diagnostics: 8/03: CT of head reveals low density serum or edema anterior to the hematoma.   Thank you,  Marciano SequinWanda Mathews-Bethea,RN,BSN, Clinical Documentation Specialist:  [phone]  [facility]- Health Information Management

## 2013-11-19 NOTE — Progress Notes (Signed)
UR completed.  Charles Andringa, RN BSN MHA CCM Trauma/Neuro ICU Case Manager [phone]  

## 2013-11-20 DIAGNOSIS — Z515 Encounter for palliative care: Secondary | ICD-10-CM

## 2013-11-20 DIAGNOSIS — K1370 Unspecified lesions of oral mucosa: Secondary | ICD-10-CM

## 2013-11-20 DIAGNOSIS — R0609 Other forms of dyspnea: Secondary | ICD-10-CM

## 2013-11-20 DIAGNOSIS — R0989 Other specified symptoms and signs involving the circulatory and respiratory systems: Secondary | ICD-10-CM

## 2013-11-20 LAB — BASIC METABOLIC PANEL
ANION GAP: 15 (ref 5–15)
BUN: 19 mg/dL (ref 6–23)
CALCIUM: 10.6 mg/dL — AB (ref 8.4–10.5)
CO2: 24 mEq/L (ref 19–32)
CREATININE: 0.92 mg/dL (ref 0.50–1.35)
Chloride: 106 mEq/L (ref 96–112)
GFR calc Af Amer: 88 mL/min — ABNORMAL LOW (ref >90)
GFR, EST NON AFRICAN AMERICAN: 76 mL/min — AB (ref >90)
Glucose, Bld: 113 mg/dL — ABNORMAL HIGH (ref 70–99)
Potassium: 3.8 mEq/L (ref 3.7–5.3)
Sodium: 145 mEq/L (ref 137–147)

## 2013-11-20 LAB — CBC
HCT: 39.6 % (ref 39.0–52.0)
Hemoglobin: 13.4 g/dL (ref 13.0–17.0)
MCH: 31.1 pg (ref 26.0–34.0)
MCHC: 33.8 g/dL (ref 30.0–36.0)
MCV: 91.9 fL (ref 78.0–100.0)
PLATELETS: 146 10*3/uL — AB (ref 150–400)
RBC: 4.31 MIL/uL (ref 4.22–5.81)
RDW: 13.6 % (ref 11.5–15.5)
WBC: 11.4 10*3/uL — ABNORMAL HIGH (ref 4.0–10.5)

## 2013-11-20 LAB — GLUCOSE, CAPILLARY
GLUCOSE-CAPILLARY: 116 mg/dL — AB (ref 70–99)
Glucose-Capillary: 129 mg/dL — ABNORMAL HIGH (ref 70–99)
Glucose-Capillary: 88 mg/dL (ref 70–99)

## 2013-11-20 LAB — CK: Total CK: 164 U/L (ref 7–232)

## 2013-11-20 MED ORDER — HYDROMORPHONE HCL PF 1 MG/ML IJ SOLN: 0.4000 mg | INTRAMUSCULAR | Status: DC | PRN

## 2013-11-20 MED ORDER — LORAZEPAM 2 MG/ML IJ SOLN: 0.5000 mg | INTRAMUSCULAR | Status: DC | PRN

## 2013-11-20 MED ORDER — ATROPINE SULFATE 1 % OP SOLN
4.0000 [drp] | OPHTHALMIC | Status: DC | PRN
  Filled 2013-11-20: qty 2

## 2013-11-20 NOTE — Progress Notes (Signed)
OT Cancellation Note  Patient Details Name: Steven Garcia MRN: [id number] DOB: 02/25/1929   Cancelled Treatment:    Reason Eval/Treat Not Completed: Other (comment) OT signing off. Pt on comfort care measures.  [facility]WARD,HILLARY Treylen Gibbs, OTR/L  [phone] 11/20/2013 11/20/2013, 7:45 AM

## 2013-11-20 NOTE — Progress Notes (Signed)
Stroke Team Progress Note   Admit Date: 11/17/2013  LOS: 3 days   HISTORY Steven Garcia is an 80 y.o. male, right handed, with a past medical history significant for DM, hypercholesterolemia, bipolar disorder, IBS, GERD, brought in by EMS after being by family unresponsive and unable to move the left side on 11/17/13. SBP 180 upon arrival to the ED. CT brain revealed a 35 x 62 mm hematoma right basal ganglia with mass effect and 5 mm midline shift. No recent trauma. No reported use of anticoagulants.  INR 1.02, PTT 29, platelets 183  Date last known well: 11/16/13  Time last known well: 1730  tPA Given: no, ICH He was admitted to the neuro ICU for further evaluation and treatment.  SUBJECTIVE No family members was at the bedside during rounds. Spoke with daughter at bedside last night and she agreed to have palliative care consult and she would like to talk with palliative care regarding comfort care. Pt more lethargic than yesterday and did not speak or mumbling at all today. However, he still able to follow some simple commands on the right hand. Overnight BP < 160. His condition especially mental status is worse than yesterday.  OBJECTIVE Most recent Vital Signs: Filed Vitals:   11/20/13 0800 11/20/13 0900 11/20/13 1000 11/20/13 1100  BP: 149/69 143/66 159/68 153/70  Pulse: 75 72 72 71  Temp: 98.3 F (36.8 C)     TempSrc: Oral     Resp: 20 20 23 21   Height:      Weight:      SpO2: 93% 95% 96% 96%   CBG (last 3)   Recent Labs  11/19/13 1909 11/19/13 2352 11/20/13 0614  GLUCAP 91 129* 88     IV Fluid Intake:   . feeding supplement (JEVITY 1.2 CAL)    . Sodium chloride (hypertonic) 2% solution 50 mL/hr at 11/20/13 0930    Medications: Scheduled:  . amLODipine  10 mg Per NG tube Daily  . antiseptic oral rinse  7 mL Mouth Rinse 6 times per day  . heparin subcutaneous  5,000 Units Subcutaneous Q12H  . insulin aspart  0-15 Units Subcutaneous 4 times per day  . pantoprazole  (PROTONIX) IV  40 mg Intravenous QHS  . senna-docusate  1 tablet Oral BID  . valproate sodium  500 mg Intravenous 3 times per day   Continuous Infusions:  . feeding supplement (JEVITY 1.2 CAL)    . Sodium chloride (hypertonic) 2% solution 50 mL/hr at 11/20/13 0930   PRN: @MEDSPRNSH @  Diet:  NPO, tube feeding on hold as per daughter's wishes Activity:  Bedrest DVT Prophylaxis:  SCDs and heparin subq  CLINICALLY SIGNIFICANT STUDIES Basic Metabolic Panel:   Recent Labs Lab 11/17/13 1825 11/18/13 0218 11/19/13 0245 11/20/13 0400  NA 128* 132* 137 145  K 4.7 4.4 4.4 3.8  CL 94* 96 98 106  CO2  --  24 26 24   GLUCOSE 139* 104* 123* 113*  BUN 8 9 14 19   CREATININE 0.90 0.93 1.01 0.92  CALCIUM  --  10.6* 10.2 10.6*  MG  --  1.5  --   --   PHOS  --  1.9*  --   --    Liver Function Tests:   Recent Labs Lab 11/17/13 1713  AST 72*  ALT 29  ALKPHOS 48  BILITOT 1.0  PROT 8.1  ALBUMIN 3.8   CBC:  Recent Labs Lab 11/17/13 1713  11/19/13 0245 11/20/13 0400  WBC 13.4*  < >  10.0 11.4*  NEUTROABS 10.8*  --   --   --   HGB 14.4  < > 12.8* 13.4  HCT 40.6  < > 37.9* 39.6  MCV 87.1  < > 90.7 91.9  PLT 183  < > 149* 146*  < > = values in this interval not displayed. Coagulation:   Recent Labs Lab 11/17/13 1713  LABPROT 13.4  INR 1.02   Cardiac Enzymes:   Recent Labs Lab 11/17/13 1713 11/20/13 0400  CKTOTAL 2587* 164   Urinalysis:   Recent Labs Lab 11/17/13 1909  COLORURINE YELLOW  LABSPEC 1.012  PHURINE 6.5  GLUCOSEU 250*  HGBUR SMALL*  BILIRUBINUR NEGATIVE  KETONESUR 15*  PROTEINUR 100*  UROBILINOGEN 1.0  NITRITE NEGATIVE  LEUKOCYTESUR NEGATIVE   Lipid Panel No results found for this basename: chol,  trig,  hdl,  cholhdl,  vldl,  ldlcalc   HgbA1C .resu Lab Results  Component Value Date   HGBA1C  Value: 7.0 (NOTE)                                                                       According to the ADA Clinical Practice Recommendations for  2011, when HbA1c is used as a screening test:   >=6.5%   Diagnostic of Diabetes Mellitus           (if abnormal result  is confirmed)  5.7-6.4%   Increased risk of developing Diabetes Mellitus  References:Diagnosis and Classification of Diabetes Mellitus,Diabetes Care,2011,34(Suppl 1):S62-S69 and Standards of Medical Care in         Diabetes - 2011,Diabetes Care,2011,34  (Suppl 1):S11-S61.* 11/17/2009    Urine Drug Screen:     Component Value Date/Time   LABOPIA NONE DETECTED 11/17/2013 1909   COCAINSCRNUR NONE DETECTED 11/17/2013 1909   LABBENZ NONE DETECTED 11/17/2013 1909   AMPHETMU NONE DETECTED 11/17/2013 1909   THCU NONE DETECTED 11/17/2013 1909   LABBARB NONE DETECTED 11/17/2013 1909    Alcohol Level:   Recent Labs Lab 11/17/13 1713  ETH <11   I have reviewed  Imaging studies below personally and independently.  Ct Head Wo Contrast  11/18/2013  IMPRESSION: No increase in size in the intracerebral hematoma, likely hypertensive in origin. Mild increase in surrounding edema. Minimal increased RIGHT-to-LEFT shift now measuring 6 mm.  Findings discussed with ordering provider.   Ct Head Wo Contrast  11/17/2013   IMPRESSION: 35 x 62 mm hematoma right basal ganglia. This most likely is due to hypertension. There is local mass-effect.  Cervical degenerative change.  Negative for acute fracture.    Ct Cervical Spine Wo Contrast  11/17/2013   IMPRESSION: 35 x 62 mm hematoma right basal ganglia. This most likely is due to hypertension. There is local mass-effect.  Cervical degenerative change.  Negative for acute fracture.   EKG  Atrial flutter. For complete results please see formal report.   Physical Exam Temp:  [98.2 F (36.8 C)-100.3 F (37.9 C)] 98.3 F (36.8 C) (08/06 0800) Pulse Rate:  [71-97] 71 (08/06 1100) Resp:  [10-29] 21 (08/06 1100) BP: (138-178)/(60-80) 153/70 mmHg (08/06 1100) SpO2:  [89 %-97 %] 96 % (08/06 1100) Weight:  [165 lb 12.6 oz (75.2 kg)] 165 lb 12.6 oz (75.2 kg) (08/06  103)  General - Well nourished, well developed, more lethargic and eyes closed. Tried once to open eyes on vice but not able to.  Ophthalmologic - not able to see through due to small pupils.  Cardiovascular - Regular rate and rhythm with no murmur.  Lung - bilateral coarse rhonchi  Neuro - very lethargic and eyes not open, not language output today. Pupil 2mm bilaterally, sluggish to light, right eye gaze preference, left neglect, left facial droop, left UE no movement on pain but left LE trace withdraw with pain stimulation, right UE and LE spontaneously movement with tremor on the R hand, follow two out of three simple commands on right UE and LE. Left babinski positive, reflex 1+ throughout.  ASSESSMENT Mr. Steven Garcia is a 80 y.o. male with PMH of HTN and DM found down at home. Found to have left hemiplegia. CT showed right BG big bleeding, around 64 cc with midline shift. His ICH score is 3 carrying 72% mortality within 30 days. Poor prognosis overall. His condition getting worse especially mental status worse than yesterday. Expect worsening clinically due to cerebral edema in the next couple of days with risk of herniation. Had long discussion with daughter last night, and she is leaning towards comfort care and like to hold off tube feeding at this time. Will call palliative care consult.   HTN  A1C 7.0  Age   TREATMENT/PLAN  Right BG bleeding  Large bleeding with 64 cc  ICH score 3 - 72% mortality within a month  BP goal < 160  Repeat CT no further bleeding  GI and DVT prophylaxis - continue heparin subq for DVT prophylaxis, on protonix  continue 2% saline @ 50cc/h which does not require central line, sodium this morning 145 on the goal (145-155)  I had long discussion with daughter, his health care surrogate, regarding His stroke, risk factors, prognosis, treatment plan and answered all questions. She understood the severity and critical condition, has made  decision for DNR and DNI. So far, she is leaning towards comfort care and like to hold off tube feeding. She wants to talk to palliative care physician this afternoon.  If decision made for comfort care, will stop 2% saline, blood draw, etc.  HTN - BP currently on the goal - continue po meds amlodipine via NG tube - keep monitoring  Apnea - pt was noted to have intermittent apnea with desaturation - will not attempt CPAP due to risk of aspiration - no intubation as per daughter given DNI - continue monitoring  DM - A1C 7.0 - Glucose 104-139 for the last 24 hours - keep monitoring   Elevated CK - check CK in am showed normalized CK  Bipolar disorder - continue depakote 500mg  bid   SIGNED Marvel Plan, MD PhD 11/20/2013 12:14 PM    To contact Stroke Continuity provider, please refer to [email or web address]. After hours, contact General Neurology

## 2013-11-20 NOTE — Progress Notes (Signed)
Patient WU:[id number]:[name]      DOB: 07/30/1928      YNW:[id number]  Spoke with daughter by phone.  Offered phone goals since she has to be at work. She stated she would rather come in person at 5 pm today.  My partner Dr. Greig RightLampkin will see then at that time.   [name], MD MBA The Palliative Medicine Team at [facility] Team Phone: [phone] Pager: [phone] ( Use team phone after hours)

## 2013-11-20 NOTE — Consult Note (Signed)
Patient NW:GNFAOZ Steven Garcia      DOB: 08/16/1928      HYQ:[id number]     Consult Note from the Palliative Medicine Team at [facility] Requested by: Dr Erlinda Hong    PCP: Simona Huh, MD Reason for Consultation: Goals of Care     Phone [phone]  Assessment/Recommendations: 80 yo male with HTN, DM who presented unresponsive and found to have [place] with large R basal ganglia CVA and worsening neurologic picture.    1.  Code Status: DNR  2. Goals of Care: Met with daughter Justice Britain and granddaughter.  Pam was able to relate conversations she had with Dr Erlinda Hong.  Aware of poor neurologic prognosis, especially in light or worsening neurologic exam.  Pam works at a nursing home and relates this experience as to how her father would not want continued aggressive care.  She describes him as previously very active and social.  Enjoyed cars. Did not feel he would want life dependent on intensive medical care, especially in light of his prognosis.  All family in agreement with switch to comfort care.  They wish to pursue residential hospice options which I think he is appropriate for.  Will d/c all non-essential meds and interventions related to comfort.  D/C NG tube, and monitors.  Suspect prognosis is most likely in days to week range given his PPS and underlying ICH with high risk of cerebral edema/herniation.  I have placed consult to social work to assist family in residential hospice options.   3. Symptom Management: Not much in way of active symptoms currently.  Certainly at risk for developing agitation, dyspnea, and pain as neurologic injury evolves.   - atropine gtt's PRN secretions - ativan PRN agitation - Will use dilaudid PRN pain/dyspnea as he has codeine allergy making it more likely he would have reaction to morphine. - continue good oral care.     4. Psychosocial/Spiritual: Daughter Jeannene Patella is only child.  Supported by her, grandchildren, and Francoise Ceo who all  agree with plan of care.  They have good spiritual support from local chaplin.  Offered chaplain services here as well.    Brief HPI: Steven Garcia is an 80 yo male with PMHx of HTN, DM, Bipolar d/o who presented from home after he was found down and unresponsive by family members.  On admission he was noted to have a large right basal ganglia hemorrhagic CVA.  He has been treated aggressively in neuro ICU with BP control, hypertonic saline.  Despite measures, he has been noted to have worsening neurologic status (more lethargic, worsening dysarthria as noted by neurology).  Discussions have been ongoing with his daughter Pam by Neurology team.   ROS: Unable to obtain with cognitive impairment in setting of severe neurologic injury   PMH:  Past Medical History  Diagnosis Date  . Diabetes     with proteinuria  . Hypercholesteremia   . Arthritis   . IBS (irritable bowel syndrome)   . GERD (gastroesophageal reflux disease)   . Bipolar disorder      PSH: Past Surgical History  Procedure Laterality Date  . Cholecystectomy    . Hemorroidectomy    . Cataract extraction, bilateral     I have reviewed the FH and SH and  If appropriate update it with new information. Allergies  Allergen Reactions  . Codeine Phosphate Other (See Comments)    Upset stomach  . Lisinopril Cough  . Protonix [Pantoprazole Sodium] Diarrhea  .  Seroquel [Quetiapine Fumarate] Other (See Comments)    GI side effects   Scheduled Meds: . amLODipine  10 mg Per NG tube Daily  . antiseptic oral rinse  7 mL Mouth Rinse 6 times per day  . heparin subcutaneous  5,000 Units Subcutaneous Q12H  . insulin aspart  0-15 Units Subcutaneous 4 times per day  . pantoprazole (PROTONIX) IV  40 mg Intravenous QHS  . senna-docusate  1 tablet Oral BID  . valproate sodium  500 mg Intravenous 3 times per day   Continuous Infusions: . feeding supplement (JEVITY 1.2 CAL)    . Sodium chloride (hypertonic) 2% solution 50 mL/hr at  11/20/13 0930   PRN Meds:.sodium chloride, acetaminophen, acetaminophen, labetalol    BP 157/72  Pulse 83  Temp(Src) 97.2 F (36.2 C) (Axillary)  Resp 33  Ht 6' (1.829 m)  Wt 75.2 kg (165 lb 12.6 oz)  BMI 22.48 kg/m2  SpO2 95%   PPS: 10   Intake/Output Summary (Last 24 hours) at 11/20/13 1531 Last data filed at 11/20/13 1400  Gross per 24 hour  Intake 1726.67 ml  Output   2685 ml  Net -958.33 ml    Physical Exam:  General: Laying in bed, NAD HEENT:  NG tube in place. Right pupil pinpoint Chest:   CTAB, symm exp CVS: RRR Abdomen: soft, NT, +BS Ext: Unable to move left side Neuro:Intermittently follows commands, attempts to verbalize few words but difficult to understand with dysarthria.    Labs: CBC    Component Value Date/Time   WBC 11.4* 11/20/2013 0400   RBC 4.31 11/20/2013 0400   HGB 13.4 11/20/2013 0400   HCT 39.6 11/20/2013 0400   PLT 146* 11/20/2013 0400   MCV 91.9 11/20/2013 0400   MCH 31.1 11/20/2013 0400   MCHC 33.8 11/20/2013 0400   RDW 13.6 11/20/2013 0400   LYMPHSABS 1.3 11/17/2013 1713   MONOABS 1.3* 11/17/2013 1713   EOSABS 0.0 11/17/2013 1713   BASOSABS 0.0 11/17/2013 1713    BMET    Component Value Date/Time   NA 145 11/20/2013 0400   K 3.8 11/20/2013 0400   CL 106 11/20/2013 0400   CO2 24 11/20/2013 0400   GLUCOSE 113* 11/20/2013 0400   BUN 19 11/20/2013 0400   CREATININE 0.92 11/20/2013 0400   CALCIUM 10.6* 11/20/2013 0400   GFRNONAA 76* 11/20/2013 0400   GFRAA 88* 11/20/2013 0400    CMP     Component Value Date/Time   NA 145 11/20/2013 0400   K 3.8 11/20/2013 0400   CL 106 11/20/2013 0400   CO2 24 11/20/2013 0400   GLUCOSE 113* 11/20/2013 0400   BUN 19 11/20/2013 0400   CREATININE 0.92 11/20/2013 0400   CALCIUM 10.6* 11/20/2013 0400   PROT 8.1 11/17/2013 1713   ALBUMIN 3.8 11/17/2013 1713   AST 72* 11/17/2013 1713   ALT 29 11/17/2013 1713   ALKPHOS 48 11/17/2013 1713   BILITOT 1.0 11/17/2013 1713   GFRNONAA 76* 11/20/2013 0400   GFRAA 88* 11/20/2013 0400     11/18/13 CT  Head IMPRESSION:  No increase in size in the intracerebral hematoma, likely  hypertensive in origin. Mild increase in surrounding edema. Minimal  increased RIGHT-to-LEFT shift now measuring 6 mm.   11/17/13 CT Head IMPRESSION:  35 x 62 mm hematoma right basal ganglia. This most likely is due to  hypertension. There is local mass-effect.  Cervical degenerative change. Negative for acute fracture.   Time In: 450 Time Out: 530 Total Time:  40 minutes  Greater than 50%  of this time was spent counseling and coordinating care related to the above assessment and plan.    Doran Clay D.O. Palliative Medicine Team at [facility]  Pager: [phone] Team Phone: [phone]

## 2013-11-20 NOTE — Progress Notes (Deleted)
Order to draw Blood culture from PICC by Charma IgoMichael Jeffery, PA.   Domenica Faileague, Issis Lindseth D 4:34 PM 11/20/2013

## 2013-11-21 NOTE — Consult Note (Signed)
[facility] Liaison: [facility] room available for Mr. Laurence ComptonBarton today. Spoke with daughter Elita Quickam by phone to confirm interest, answer questions, review consent, election and financial. Dr. Kern Reaponald Hertweck to assume care per Pam's request. [facility]. Please fax discharge summary to [phone] and RN please call report to [phone]. Appreciate CSW Vanessa's help with this transfer. Thank you. Forrestine Himva Geraldy Akridge LCSW [phone]

## 2013-11-21 NOTE — Care Management Note (Signed)
CARE MANAGEMENT NOTE 11/21/2013  Patient:  Steven Garcia,Steven Garcia   Account Number:  [id number]  Date Initiated:  11/21/2013  Documentation initiated by:  Johny ShockOYAL,Kayde Atkerson  Subjective/Objective Assessment:     Action/Plan:   11/21/2013 Pt for d/c to residential hospice facility.   Anticipated DC Date:  11/21/2013   Anticipated DC Plan[facility]  [facility]         Choice offered to / List presented to:             Status of service:   Medicare Important Message given?  NO (If response is "NO", the following Medicare IM given date fields will be blank) Date Medicare IM given:   Medicare IM given by:   Date Additional Medicare IM given:   Additional Medicare IM given by:    Discharge Disposition:    Per UR Regulation:    If discussed at Long Length of Stay Meetings, dates discussed:    Comments:  11/21/2013 family has requested residental hospice services for this pt and arrangements being made for transfer to his facility by CSW.    Johny Shockheryl Delray Reza RN MPH [phone].

## 2013-11-21 NOTE — Progress Notes (Signed)
Patient ZO:XWRUEA:Steven Garcia      DOB: 10/24/1928      VWU:[id number]   [facility] Progress Note    Subjective: More dysarthric today.  Unable to understand any mumbled words and unable to obtain any ROS.      Filed Vitals:   11/21/13 0444  BP: 138/77  Pulse: 95  Temp: 97.3 F (36.3 C)  Resp: 22   Physical exam: GEN: moves right arm some, more dysarthric today HEENT: [place], left facial droop, dry mm CV: RRR LUNGS: CTAB, symm expansion ABD: soft, ND, +BS EXT: no edema, warm NEURO: follows commands intermittently. Not able to wiggle toes bilaterally for me today.    Assessment and plan: 80 yo male with HTN, DM who presented unresponsive and found to have ICH with large R basal ganglia CVA and worsening neurologic picture.   1. Code Status: DNR   2. Goals of Care:  See initial consultation note on 11/20/13. Goal of care is comfort.  They would prefer transfer to residential hospice facility which I think is appropriate. I have placed social work consult to help with assisting family with referral.    3. Symptom Management: Potential terminal agitation, delirium, dyspnea/pain No PRN needs overnight.  - atropine gtt's PRN secretions  - ativan PRN agitation  - Will use dilaudid PRN pain/dyspnea as he has codeine allergy making it more likely he would have reaction to morphine.  - continue good oral care. I swabbed his mouth today at bedside.   4. Psychosocial/Spiritual: Daughter Elita Quickam is only child. Supported by her, grandchildren, and Steven Garcia who all agree with plan of care. They have good spiritual support from local chaplin. Offered chaplain services here as well.    Orvis BrillAaron J. Demosthenes Virnig D.O. Palliative Medicine Team at [facility]  Pager: [phone] Team Phone: [phone]

## 2013-11-21 NOTE — Progress Notes (Signed)
Spoke with Dr. Roda ShuttersXu, [facility], needed discharge summary.  Dr. Roda ShuttersXu stated it will be about 30 minutes and he would put it in.  [facility] [phone].

## 2013-11-21 NOTE — Discharge Summary (Signed)
Stroke Discharge Summary  Patient ID: Steven Garcia   MRN: [id number]      DOB: Nov 26, 1928  Date of Admission: 11/17/2013 Date of Discharge: 11/21/2013  Attending Physician:  Rosalin Hawking, MD, Stroke MD  Consulting Physician(s):    Md Stroke, MD Palliative Alla Feeling    Patient's PCP:  Simona Huh, MD  Discharge Diagnoses:  Active Problems:   Stroke due to intracerebral hemorrhage   ICH (intracerebral hemorrhage)   HTN   DM   Bipolar disorder  Past Medical History  Diagnosis Date  . Diabetes     with proteinuria  . Hypercholesteremia   . Arthritis   . IBS (irritable bowel syndrome)   . GERD (gastroesophageal reflux disease)   . Bipolar disorder    Past Surgical History  Procedure Laterality Date  . Cholecystectomy    . Hemorroidectomy    . Cataract extraction, bilateral       Medication List    ASK your doctor about these medications       ACCU-CHEK AVIVA PLUS test strip  Generic drug:  glucose blood     ACCU-CHEK AVIVA PLUS W/DEVICE Kit     ACCU-CHEK SOFTCLIX LANCETS lancets     aspirin EC 81 MG tablet  Take 81 mg by mouth daily.     divalproex 500 MG DR tablet  Commonly known as:  DEPAKOTE  Take 500 mg by mouth 3 (three) times daily.     Fish Oil 1000 MG Caps  Take 1,000 mg by mouth daily.     glipiZIDE 5 MG 24 hr tablet  Commonly known as:  GLUCOTROL XL  Take 5 mg by mouth daily with breakfast.     lisinopril 20 MG tablet  Commonly known as:  PRINIVIL,ZESTRIL  Take 20 mg by mouth daily.     lovastatin 20 MG tablet  Commonly known as:  MEVACOR  Take 20 mg by mouth at bedtime.     metformin 500 MG (OSM) 24 hr tablet  Commonly known as:  FORTAMET  Take 1,000 mg by mouth 2 (two) times daily with a meal.     omeprazole 20 MG capsule  Commonly known as:  PRILOSEC  Take 20 mg by mouth daily.       LABORATORY STUDIES CBC    Component Value Date/Time   WBC 11.4* 11/20/2013 0400   RBC 4.31 11/20/2013 0400   HGB 13.4 11/20/2013 0400   HCT  39.6 11/20/2013 0400   PLT 146* 11/20/2013 0400   MCV 91.9 11/20/2013 0400   MCH 31.1 11/20/2013 0400   MCHC 33.8 11/20/2013 0400   RDW 13.6 11/20/2013 0400   LYMPHSABS 1.3 11/17/2013 1713   MONOABS 1.3* 11/17/2013 1713   EOSABS 0.0 11/17/2013 1713   BASOSABS 0.0 11/17/2013 1713   CMP    Component Value Date/Time   NA 145 11/20/2013 0400   K 3.8 11/20/2013 0400   CL 106 11/20/2013 0400   CO2 24 11/20/2013 0400   GLUCOSE 113* 11/20/2013 0400   BUN 19 11/20/2013 0400   CREATININE 0.92 11/20/2013 0400   CALCIUM 10.6* 11/20/2013 0400   PROT 8.1 11/17/2013 1713   ALBUMIN 3.8 11/17/2013 1713   AST 72* 11/17/2013 1713   ALT 29 11/17/2013 1713   ALKPHOS 48 11/17/2013 1713   BILITOT 1.0 11/17/2013 1713   GFRNONAA 76* 11/20/2013 0400   GFRAA 88* 11/20/2013 0400   COAGS Lab Results  Component Value Date   INR 1.02 11/17/2013  INR 1.13 11/16/2009   Lipid Panel No results found for this basename: chol, trig, hdl, cholhdl, vldl, ldlcalc   HgbA1C  Lab Results  Component Value Date   HGBA1C  Value: 7.0 (NOTE)                                                                       According to the ADA Clinical Practice Recommendations for 2011, when HbA1c is used as a screening test:   >=6.5%   Diagnostic of Diabetes Mellitus           (if abnormal result  is confirmed)  5.7-6.4%   Increased risk of developing Diabetes Mellitus  References:Diagnosis and Classification of Diabetes Mellitus,Diabetes Care,2011,34(Suppl 1):S62-S69 and Standards of Medical Care in         Diabetes - 2011,Diabetes Care,2011,34  (Suppl 1):S11-S61.* 11/17/2009   Cardiac Panel (last 3 results)  Recent Labs  11/20/13 0400  CKTOTAL 164   Urinalysis    Component Value Date/Time   COLORURINE YELLOW 11/17/2013 1909   APPEARANCEUR CLEAR 11/17/2013 1909   LABSPEC 1.012 11/17/2013 1909   PHURINE 6.5 11/17/2013 1909   GLUCOSEU 250* 11/17/2013 1909   HGBUR SMALL* 11/17/2013 1909   BILIRUBINUR NEGATIVE 11/17/2013 1909   KETONESUR 15* 11/17/2013 1909   PROTEINUR 100* 11/17/2013 1909    UROBILINOGEN 1.0 11/17/2013 1909   NITRITE NEGATIVE 11/17/2013 1909   LEUKOCYTESUR NEGATIVE 11/17/2013 1909   Urine Drug Screen    Component Value Date/Time   LABOPIA NONE DETECTED 11/17/2013 1909   COCAINSCRNUR NONE DETECTED 11/17/2013 1909   LABBENZ NONE DETECTED 11/17/2013 1909   AMPHETMU NONE DETECTED 11/17/2013 1909   THCU NONE DETECTED 11/17/2013 1909   LABBARB NONE DETECTED 11/17/2013 1909    Alcohol Level    Component Value Date/Time   ETH <11 11/17/2013 1713     SIGNIFICANT DIAGNOSTIC STUDIES     History of Present Illness    Steven Garcia is an 80 y.o. male, right handed, with a past medical history significant for DM, hypercholesterolemia, bipolar disorder, IBS, GERD, brought in by EMS after being by family unresponsive and unable to move the left side on 11/17/13. SBP 180 upon arrival to the ED. CT brain revealed a 35 x 62 mm hematoma right basal ganglia with mass effect and 5 mm midline shift. No recent trauma. No reported use of anticoagulants.  INR 1.02, PTT 29, platelets 183   Hospital Course  He was admitted to the neuro ICU for further evaluation and treatment. He was monitored on BP and goal < 160. He received prn labetalol for BP control at beginning and later we put NG tube for oral HTN meds. He was initially put on mannitol Q6h and then switched to 2% saline infusion for cerebral edema. His BP was on the goal most of the time. He also has high A1C level and glucose level and on SSI. We gave him protonix for GI prophylaxis. Repeat CT in 24 hours did not show significant enlargement of hematoma. His condition somehow stable but mental status gradually worsening. Had long discussion with daughter, given his large ICH and ICH score at 3 (72% mortality rate within 30 days) and his age, he will have poor neurological prognosis and  high risk of cerebral herniation. Daughter understood the situation, and we have palliative consult for him. Decision is made for palliative care / comfort care  and pt transferred to floor. Pt was subsequently discharged to hospice.  Discharge Exam  Blood pressure 120/74, pulse 90, temperature 98.4 F (36.9 C), temperature source Oral, resp. rate 22, height 6' (1.829 m), weight 165 lb 12.6 oz (75.201 kg), SpO2 95.00%.   General - Well nourished, well developed, more lethargic and eyes closed. Tried once to open eyes on vice but not able to.  Ophthalmologic - not able to see through due to small pupils.  Cardiovascular - Regular rate and rhythm with no murmur.  Lung - bilateral coarse rhonchi  Neuro - very lethargic and eyes not open, mumbling speech and not able to understand. Did not following commands today. Pupil 29mm bilaterally, sluggish to light, right eye gaze preference, left neglect, left facial droop, left UE no movement on pain but left LE trace withdraw with pain stimulation, right UE and LE spontaneously movement with tremor on the R hand, did not commands on right UE and LE. Left babinski positive, reflex 1+ throughout.  Discharge Diet   NPO  Discharge Plan    Disposition:  hospice   Please continue comfort care in hospice as per the protocol.  25 minutes were spent preparing discharge.  Signed Rosalin Hawking, MD PhD Stroke Neurology 11/21/2013 1:37 PM

## 2013-11-21 NOTE — Progress Notes (Signed)
Palliative care consulted.  Pt no IV fluids. Use mouth cleanser for oral care.

## 2013-11-21 NOTE — Progress Notes (Signed)
[facility] by [facility]cher.

## 2013-11-21 NOTE — Clinical Social Work Note (Addendum)
CSW received consult 8/6 for Residential Hospice Placement. Call made to daughter, Rexford Mausamela Sayer [phone]) to offer choice and [facility] and [facility]. Call made to Forrestine HimEva Davis, CSW with [facility] and referral made. She requested that CSW fax over the following clinicals to [facility]: Face sheet, H&P, last progress note and MAR. Information faxed as requested. CSW will continue to follow and assist with discharge to hospice facility.  [facility] and [facility] can [facility] today.  Patient's nurse informed and requested that MD be contacted for d/c summary. CSW contacted daughter to update her and informed her that she will be contacted by [facility] regarding admissions paperwork.  Patient will transport to hospice facility via ambulance.  Genelle BalVanessa Zebulan Hinshaw, MSW, LCSW [phone]

## 2013-11-21 NOTE — Progress Notes (Signed)
[facility]  Paged Dr. Roda ShuttersXu for discharge summary.  Also paged Dr. Greig Right[place]ampkin palliative care.

## 2013-11-24 LAB — CULTURE, BLOOD (ROUTINE X 2)
CULTURE: NO GROWTH
Culture: NO GROWTH

## 2013-12-16 DEATH — deceased

## 2014-12-17 IMAGING — CR DG ABD PORTABLE 1V
1 series · 1 of 1 positions shown · non-contrast
Comparison: None.

CLINICAL DATA: Feeding tube placement

EXAM:
PORTABLE ABDOMEN - 1 VIEW

[AP]
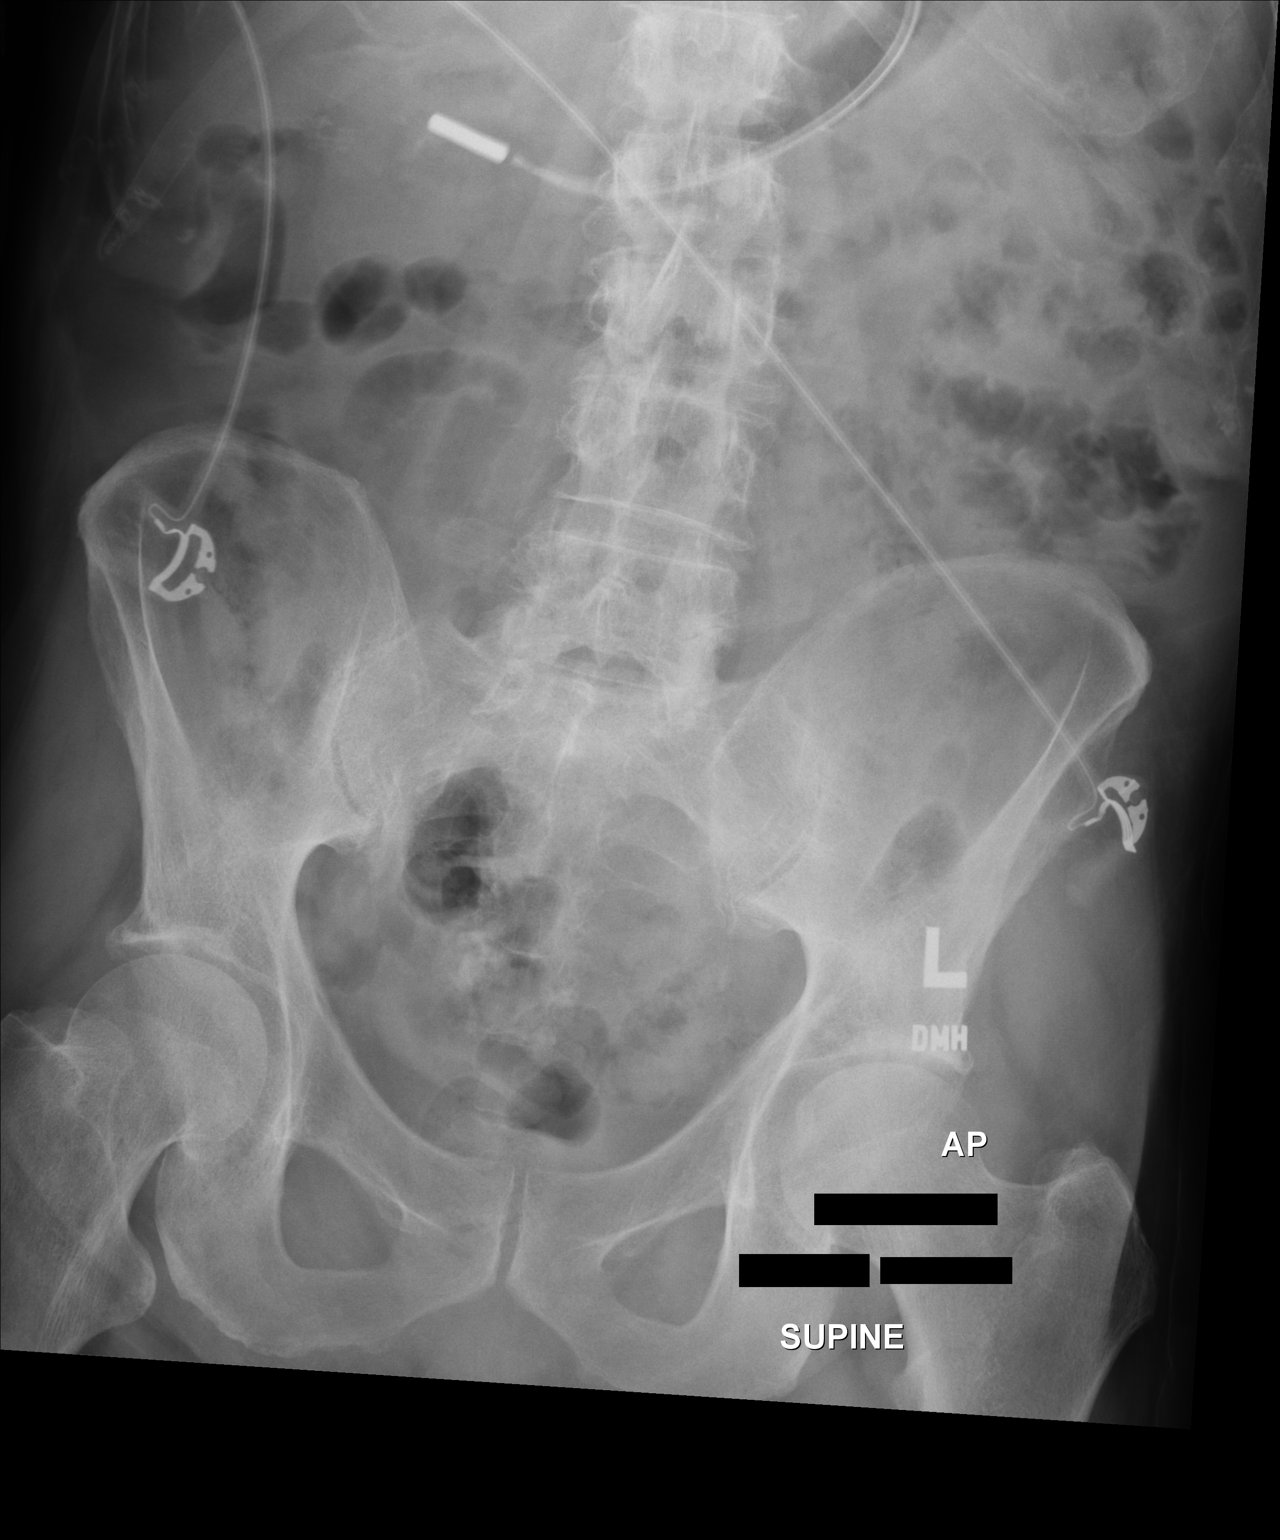

[1 of 1 positions shown; findings below may reference images not displayed]

FINDINGS: Feeding tube tip is at the duodenal bulb.
IMPRESSION: Feeding tube tip is at the duodenal bulb.
